# Patient Record
Sex: Female | Born: 1960 | Race: White | Hispanic: No | Marital: Married | Smoking: Former smoker
Health system: Southern US, Community
[De-identification: ages and names within clinical notes are randomized; demographics above are authoritative.]

## PROBLEM LIST (undated history)

## (undated) DIAGNOSIS — G510 Bell's palsy: Secondary | ICD-10-CM

## (undated) DIAGNOSIS — E119 Type 2 diabetes mellitus without complications: Secondary | ICD-10-CM

## (undated) DIAGNOSIS — D3500 Benign neoplasm of unspecified adrenal gland: Secondary | ICD-10-CM

## (undated) DIAGNOSIS — I341 Nonrheumatic mitral (valve) prolapse: Secondary | ICD-10-CM

## (undated) DIAGNOSIS — I219 Acute myocardial infarction, unspecified: Secondary | ICD-10-CM

## (undated) DIAGNOSIS — E785 Hyperlipidemia, unspecified: Secondary | ICD-10-CM

## (undated) DIAGNOSIS — K635 Polyp of colon: Secondary | ICD-10-CM

## (undated) HISTORY — PX: OTHER SURGICAL HISTORY: SHX169

## (undated) HISTORY — PX: ABDOMINAL HYSTERECTOMY: SHX81

## (undated) HISTORY — DX: Bell's palsy: G51.0

## (undated) HISTORY — PX: TONSILLECTOMY: SUR1361

## (undated) HISTORY — DX: Acute myocardial infarction, unspecified: I21.9

---

## 2012-12-23 DIAGNOSIS — E039 Hypothyroidism, unspecified: Secondary | ICD-10-CM | POA: Insufficient documentation

## 2013-02-17 DIAGNOSIS — Z6835 Body mass index (BMI) 35.0-35.9, adult: Secondary | ICD-10-CM | POA: Insufficient documentation

## 2013-03-01 DIAGNOSIS — E1169 Type 2 diabetes mellitus with other specified complication: Secondary | ICD-10-CM | POA: Insufficient documentation

## 2013-03-01 DIAGNOSIS — E119 Type 2 diabetes mellitus without complications: Secondary | ICD-10-CM | POA: Insufficient documentation

## 2013-04-19 ENCOUNTER — Emergency Department
Admission: EM | Admit: 2013-04-19 | Discharge: 2013-04-19 | Disposition: A | Discharge status: Home / Self Care | Payer: Managed Care, Other (non HMO) | Source: Home / Self Care | Attending: Family Medicine | Admitting: Family Medicine

## 2013-04-19 ENCOUNTER — Encounter: Payer: Self-pay | Admitting: Emergency Medicine

## 2013-04-19 DIAGNOSIS — L678 Other hair color and hair shaft abnormalities: Secondary | ICD-10-CM

## 2013-04-19 DIAGNOSIS — L738 Other specified follicular disorders: Secondary | ICD-10-CM

## 2013-04-19 DIAGNOSIS — L739 Follicular disorder, unspecified: Secondary | ICD-10-CM

## 2013-04-19 HISTORY — DX: Type 2 diabetes mellitus without complications: E11.9

## 2013-04-19 HISTORY — DX: Polyp of colon: K63.5

## 2013-04-19 HISTORY — DX: Benign neoplasm of unspecified adrenal gland: D35.00

## 2013-04-19 HISTORY — DX: Nonrheumatic mitral (valve) prolapse: I34.1

## 2013-04-19 HISTORY — DX: Hyperlipidemia, unspecified: E78.5

## 2013-04-19 MED ORDER — DOXYCYCLINE HYCLATE 100 MG PO CAPS: 100.0000 mg | ORAL_CAPSULE | Freq: Two times a day (BID) | ORAL | Status: DC

## 2013-04-19 NOTE — Discharge Instructions (Signed)
May take Benadryl for itching.

## 2013-04-19 NOTE — ED Notes (Signed)
Rash to both arms x 5 days. She was recently in [facility] staying in a hotel. Her husband does not have the rash/bites but her son did have a few spots on his fingers. Rash has spread since visit. C/o itching.

## 2013-04-19 NOTE — ED Provider Notes (Signed)
CSN: [id number]     Arrival date & time 04/19/13  2376 History   First MD Initiated Contact with Patient 04/19/13 [phone]     Chief Complaint  Patient presents with  . Rash      HPI Comments: Patient reports that she was at [facility] last week.  She admits that she did spend some time in a hot tub while in her hotel.  Four days ago she developed some small red bumps on her left arm that gradually increased in diameter.  She developed several similar lesions on right arm and upper legs.  The lesions are mildly pruritic.  She feels well otherwise, although she recalls feeling briefly fatigued with chills about a week ago.  Patient is a 53 y.o. female presenting with rash. The history is provided by the patient and the spouse.  Rash Pain location: arms and upper legs. Duration:  1 week Timing:  Constant Progression:  Worsening Chronicity:  New Context comment:  Beach trip Relieved by:  None tried Worsened by:  Nothing tried Ineffective treatments:  None tried Associated symptoms: no anorexia, no chills, no cough, no dysuria, no fatigue, no fever, no nausea, no sore throat and no vomiting     Past Medical History  Diagnosis Date  . Diabetes mellitus without complication   . Hyperlipemia   . Colon polyps   . Benign tumor of adrenal gland   . Mitral valve prolapse    Past Surgical History  Procedure Laterality Date  . Tonsillectomy    . Cesarean section    . Abdominal hysterectomy     Family History  Problem Relation Age of Onset  . Cancer Mother     breast CA  . Diabetes Mother   . Rheum arthritis Mother   . Rheum arthritis Father   . Heart disease Sister    History  Substance Use Topics  . Smoking status: Former Research scientist (life sciences)  . Smokeless tobacco: Never Used  . Alcohol Use: No   OB History   Grav Para Term Preterm Abortions TAB SAB Ect Mult Living                 Review of Systems  Constitutional: Negative for fever, chills and fatigue.  HENT: Negative for sore throat.    Respiratory: Negative for cough.   Gastrointestinal: Negative for nausea, vomiting and anorexia.  Genitourinary: Negative for dysuria.  Skin: Positive for rash.  All other systems reviewed and are negative.    Allergies  Codeine; Dilaudid; Motrin; Penicillins; and Quinolones  Home Medications   Current Outpatient Rx  Name  Route  Sig  Dispense  Refill  . Cholecalciferol (VITAMIN D-3 PO)   Oral   Take by mouth.         . estradiol (VIVELLE-DOT) 0.025 MG/24HR   Transdermal   Place 1 patch onto the skin 2 (two) times a week.         . Levothyroxine Sodium (TIROSINT) 100 MCG CAPS   Oral   Take by mouth daily before breakfast.         . metFORMIN (GLUCOPHAGE-XR) 750 MG 24 hr tablet   Oral   Take 750 mg by mouth daily with breakfast.         . progesterone (PROMETRIUM) 100 MG capsule   Oral   Take 100 mg by mouth daily.         Marland Kitchen doxycycline (VIBRAMYCIN) 100 MG capsule   Oral   Take 1 capsule (100 mg  total) by mouth 2 (two) times daily.   14 capsule   0    BP 144/73  Pulse 92  Temp(Src) 98 F (36.7 C) (Oral)  Resp 14  Ht 5\' 2"  (1.575 m)  Wt 198 lb (89.812 kg)  BMI 36.21 kg/m2  SpO2 96% Physical Exam  Nursing note and vitals reviewed. Constitutional: She is oriented to person, place, and time. She appears well-developed and well-nourished. No distress.  HENT:  Head: Normocephalic.  Nose: Nose normal.  Mouth/Throat: Oropharynx is clear and moist.  Eyes: Conjunctivae are normal. Pupils are equal, round, and reactive to light.  Neck: Neck supple.  Cardiovascular: Normal heart sounds.   Pulmonary/Chest: Breath sounds normal.  Abdominal: There is no tenderness.  Lymphadenopathy:    She has no cervical adenopathy.  Neurological: She is alert and oriented to person, place, and time.  Skin: Skin is warm and dry. Rash noted. Rash is macular.     Patient has scattered erythematous follicular lesions about 37mm dia with central eschar.  Lesions on left  arm have somewhat of a target appearance, ranging in size from about 56mm to 58mm.    ED Course  Procedures  none       MDM   1. Folliculitis;  "Hot tub folliculitis" is a possibility (usually caused by pseudomonas) which would tend to resolve spontaneously.    Begin doxycycline to cover staph May take Benadryl for itching. Followup with dermatologist if not improving one week.    Kandra Nicolas, MD 04/19/13 1147

## 2013-06-09 DIAGNOSIS — E781 Pure hyperglyceridemia: Secondary | ICD-10-CM | POA: Insufficient documentation

## 2013-06-23 ENCOUNTER — Encounter: Payer: Managed Care, Other (non HMO) | Admitting: Family Medicine

## 2013-07-06 ENCOUNTER — Ambulatory Visit (INDEPENDENT_AMBULATORY_CARE_PROVIDER_SITE_OTHER): Payer: Managed Care, Other (non HMO) | Admitting: Obstetrics & Gynecology

## 2013-07-06 ENCOUNTER — Telehealth: Payer: Self-pay | Admitting: *Deleted

## 2013-07-06 ENCOUNTER — Encounter: Payer: Self-pay | Admitting: Obstetrics & Gynecology

## 2013-07-06 VITALS — BP 131/76 | HR 79 | Resp 16 | Ht 62.0 in | Wt 195.0 lb

## 2013-07-06 DIAGNOSIS — Z01419 Encounter for gynecological examination (general) (routine) without abnormal findings: Secondary | ICD-10-CM

## 2013-07-06 DIAGNOSIS — Z0000 Encounter for general adult medical examination without abnormal findings: Secondary | ICD-10-CM

## 2013-07-06 NOTE — Progress Notes (Signed)
Subjective:    Tiffany Riddle is a 53 y.o. female who presents for an annual exam. The patient has no complaints today. She will need a refill of her Vivelle.She has been on it for 3 or 4 years. The patient is not currently sexually active. GYN screening history: last pap: was normal. The patient wears seatbelts: yes. The patient participates in regular exercise: yes. Has the patient ever been transfused or tattooed?: no. The patient reports that there is not domestic violence in her life.   Menstrual History: OB History   Grav Para Term Preterm Abortions TAB SAB Ect Mult Living   _0 Menarche age: 53  No LMP recorded. Patient has had a hysterectomy.    The following portions of the patient's history were reviewed and updated as appropriate: allergies, current medications, past family history, past medical history, past social history, past surgical history and problem list.  Review of Systems A comprehensive review of systems was negative. Her BRCA testing was negative, but she has a strong FH of breast cancer. Married for 24 years, Moved here from [place] last year. Homemaker. Abstinent because her husband has MS.She seen Julie Martinique, NP in [place].   Objective:    BP 131/76  Pulse 79  Resp 16  Ht _1  (1.575 m)  Wt 195 lb (88.451 kg)  BMI 35.66 kg/m2  General Appearance:    Alert, cooperative, no distress, appears stated age  Head:    Normocephalic, without obvious abnormality, atraumatic  Eyes:    PERRL, conjunctiva/corneas clear, EOM's intact, fundi    benign, both eyes  Ears:    Normal TM's and external ear canals, both ears  Nose:   Nares normal, septum midline, mucosa normal, no drainage    or sinus tenderness  Throat:   Lips, mucosa, and tongue normal; teeth and gums normal  Neck:   Supple, symmetrical, trachea midline, no adenopathy;    thyroid:  no enlargement/tenderness/nodules; no carotid   bruit or JVD  Back:     Symmetric, no curvature, ROM  normal, no CVA tenderness  Lungs:     Clear to auscultation bilaterally, respirations unlabored  Chest Wall:    No tenderness or deformity   Heart:    Regular rate and rhythm, S1 and S2 normal, no murmur, rub   or gallop  Breast Exam:    No tenderness, masses, or nipple abnormality  Abdomen:     Soft, non-tender, bowel sounds active all four quadrants,    no masses, no organomegaly  Genitalia:    Normal female without lesion, discharge or tenderness, No masses on bimanual     Extremities:   Extremities normal, atraumatic, no cyanosis or edema  Pulses:   2+ and symmetric all extremities  Skin:   Skin color, texture, turgor normal, no rashes or lesions  Lymph nodes:   Cervical, supraclavicular, and axillary nodes normal  Neurologic:   CNII-XII intact, normal strength, sensation and reflexes    throughout  .    Assessment:    Healthy female exam.    Plan:     Breast self exam technique reviewed and patient encouraged to perform self-exam monthly. Mammogram.  We had a long talk about HRT and their risks and benefits. I will continue to prescribe them as she likes. I also agreed to decrease the dose of estrogen. She will call with her choice. She is also aware that she  can stop them all.

## 2013-07-06 NOTE — Telephone Encounter (Signed)
ERROR

## 2013-07-17 DIAGNOSIS — Z860101 Personal history of adenomatous and serrated colon polyps: Secondary | ICD-10-CM | POA: Insufficient documentation

## 2013-09-22 ENCOUNTER — Other Ambulatory Visit: Payer: Self-pay | Admitting: *Deleted

## 2013-09-22 DIAGNOSIS — N951 Menopausal and female climacteric states: Secondary | ICD-10-CM

## 2013-09-22 MED ORDER — AMBULATORY NON FORMULARY MEDICATION: 1.0000 | Freq: Every day | Status: DC

## 2013-09-22 NOTE — Telephone Encounter (Signed)
Pt request RF on compounded estrogen.  She request it be switched to [place].

## 2013-11-16 DIAGNOSIS — Z803 Family history of malignant neoplasm of breast: Secondary | ICD-10-CM | POA: Insufficient documentation

## 2013-11-16 DIAGNOSIS — Z83719 Family history of colon polyps, unspecified: Secondary | ICD-10-CM | POA: Insufficient documentation

## 2014-01-17 ENCOUNTER — Encounter: Payer: Self-pay | Admitting: Obstetrics & Gynecology

## 2014-08-17 ENCOUNTER — Other Ambulatory Visit: Payer: Self-pay | Admitting: *Deleted

## 2014-08-17 DIAGNOSIS — N951 Menopausal and female climacteric states: Secondary | ICD-10-CM

## 2014-08-17 MED ORDER — AMBULATORY NON FORMULARY MEDICATION: 1.0000 | Freq: Every day | Status: DC

## 2014-08-24 ENCOUNTER — Ambulatory Visit (INDEPENDENT_AMBULATORY_CARE_PROVIDER_SITE_OTHER): Payer: Managed Care, Other (non HMO) | Admitting: Obstetrics & Gynecology

## 2014-08-24 ENCOUNTER — Encounter: Payer: Self-pay | Admitting: Obstetrics & Gynecology

## 2014-08-24 VITALS — Ht 62.0 in | Wt 197.0 lb

## 2014-08-24 DIAGNOSIS — Z0000 Encounter for general adult medical examination without abnormal findings: Secondary | ICD-10-CM

## 2014-08-24 DIAGNOSIS — Z01419 Encounter for gynecological examination (general) (routine) without abnormal findings: Secondary | ICD-10-CM

## 2014-08-24 DIAGNOSIS — N898 Other specified noninflammatory disorders of vagina: Secondary | ICD-10-CM

## 2014-08-24 MED ORDER — FLUCONAZOLE 150 MG PO TABS: 150.0000 mg | ORAL_TABLET | Freq: Once | ORAL | Status: DC

## 2014-08-24 MED ORDER — ESTRADIOL 0.025 MG/24HR TD PTTW: 1.0000 | MEDICATED_PATCH | TRANSDERMAL | Status: DC

## 2014-08-24 NOTE — Progress Notes (Signed)
Subjective:    Tiffany Riddle is a 54 y.o. MW P28  female who presents for an annual exam. The patient has no complaints today. The patient is not currently sexually active. GYN screening history: last pap: was normal. The patient wears seatbelts: yes. The patient participates in regular exercise: yes. Has the patient ever been transfused or tattooed?: no. The patient reports that there is not domestic violence in her life.   Menstrual History: OB History    Gravida Para Term Preterm AB TAB SAB Ectopic Multiple Living   1 1        1       Menarche age: 84  No LMP recorded. Patient has had a hysterectomy.    The following portions of the patient's history were reviewed and updated as appropriate: allergies, current medications, past family history, past medical history, past social history, past surgical history and problem list.  Review of Systems A comprehensive review of systems was negative. Her husband has MS. She is a Agricultural engineer.   Objective:    Ht 5\' 2"  (1.575 m)  Wt 197 lb (89.359 kg)  BMI 36.02 kg/m2  General Appearance:    Alert, cooperative, no distress, appears stated age  Head:    Normocephalic, without obvious abnormality, atraumatic  Eyes:    PERRL, conjunctiva/corneas clear, EOM's intact, fundi    benign, both eyes  Ears:    Normal TM's and external ear canals, both ears  Nose:   Nares normal, septum midline, mucosa normal, no drainage    or sinus tenderness  Throat:   Lips, mucosa, and tongue normal; teeth and gums normal  Neck:   Supple, symmetrical, trachea midline, no adenopathy;    thyroid:  no enlargement/tenderness/nodules; no carotid   bruit or JVD  Back:     Symmetric, no curvature, ROM normal, no CVA tenderness  Lungs:     Clear to auscultation bilaterally, respirations unlabored  Chest Wall:    No tenderness or deformity   Heart:    Regular rate and rhythm, S1 and S2 normal, no murmur, rub   or gallop  Breast Exam:    No tenderness, masses, or nipple  abnormality  Abdomen:     Soft, non-tender, bowel sounds active all four quadrants,    no masses, no organomegaly  Genitalia:    Normal female without lesion, discharge or tenderness, inflamed vulva, discharge c/w yeast, no masses noted with bimanual exam     Extremities:   Extremities normal, atraumatic, no cyanosis or edema  Pulses:   2+ and symmetric all extremities  Skin:   Skin color, texture, turgor normal, no rashes or lesions  Lymph nodes:   Cervical, supraclavicular, and axillary nodes normal  Neurologic:   CNII-XII intact, normal strength, sensation and reflexes    throughout  .    Assessment:    Healthy female exam.    Plan:     Breast self exam technique reviewed and patient encouraged to perform self-exam monthly. Wet prep. diflucan and refill of vivelle   3D mammogram

## 2014-08-25 LAB — WET PREP, GENITAL
Clue Cells Wet Prep HPF POC: NONE SEEN
TRICH WET PREP: NONE SEEN
WBC, Wet Prep HPF POC: NONE SEEN
YEAST WET PREP: NONE SEEN

## 2014-09-12 ENCOUNTER — Telehealth: Payer: Self-pay | Admitting: *Deleted

## 2014-09-12 DIAGNOSIS — Z780 Asymptomatic menopausal state: Secondary | ICD-10-CM

## 2014-09-12 MED ORDER — ESTRADIOL 0.075 MG/24HR TD PTTW: 1.0000 | MEDICATED_PATCH | TRANSDERMAL | Status: DC

## 2014-09-12 NOTE — Telephone Encounter (Signed)
Pt called stating that her RF on Vivelle Dot was called in as 0.025 instead of 0.075.  I called pharmacy and they said she has always gotten 0.075 mg.  RX changed and sent to [facility].

## 2015-05-08 ENCOUNTER — Other Ambulatory Visit: Payer: Self-pay | Admitting: *Deleted

## 2015-05-08 DIAGNOSIS — Z780 Asymptomatic menopausal state: Secondary | ICD-10-CM

## 2015-05-08 MED ORDER — ESTRADIOL 0.075 MG/24HR TD PTTW: 1.0000 | MEDICATED_PATCH | TRANSDERMAL | Status: DC

## 2015-05-08 NOTE — Telephone Encounter (Signed)
RF authorization for Vivelle Dot 0.075 mg sent to [facility] delivery.  Next appt due June 2017

## 2015-08-11 DIAGNOSIS — J449 Chronic obstructive pulmonary disease, unspecified: Secondary | ICD-10-CM | POA: Insufficient documentation

## 2015-12-26 ENCOUNTER — Other Ambulatory Visit: Payer: Self-pay | Admitting: Obstetrics & Gynecology

## 2015-12-26 DIAGNOSIS — Z780 Asymptomatic menopausal state: Secondary | ICD-10-CM

## 2015-12-28 ENCOUNTER — Other Ambulatory Visit: Payer: Self-pay

## 2015-12-28 DIAGNOSIS — Z780 Asymptomatic menopausal state: Secondary | ICD-10-CM

## 2015-12-28 MED ORDER — ESTRADIOL 0.075 MG/24HR TD PTTW: 1.0000 | MEDICATED_PATCH | TRANSDERMAL | 1 refills | Status: DC

## 2015-12-28 NOTE — Telephone Encounter (Signed)
Pt called and needed Vivelle Dot refilled. I told her that she needed an appt before we could do anymore refills and she stated that she was out of town for a while. I spoke with Sherryle Lis, RN and she said we could give her one more refill. Pt stated that she will call and make appt soon.

## 2016-02-01 ENCOUNTER — Ambulatory Visit (INDEPENDENT_AMBULATORY_CARE_PROVIDER_SITE_OTHER): Payer: Managed Care, Other (non HMO) | Admitting: Obstetrics & Gynecology

## 2016-02-01 ENCOUNTER — Encounter: Payer: Self-pay | Admitting: Obstetrics & Gynecology

## 2016-02-01 VITALS — BP 125/66 | HR 80 | Resp 16 | Ht 62.0 in | Wt 178.0 lb

## 2016-02-01 DIAGNOSIS — Z780 Asymptomatic menopausal state: Secondary | ICD-10-CM

## 2016-02-01 DIAGNOSIS — Z01419 Encounter for gynecological examination (general) (routine) without abnormal findings: Secondary | ICD-10-CM | POA: Diagnosis not present

## 2016-02-01 DIAGNOSIS — Z803 Family history of malignant neoplasm of breast: Secondary | ICD-10-CM

## 2016-02-01 MED ORDER — ESTRADIOL 0.075 MG/24HR TD PTTW: 1.0000 | MEDICATED_PATCH | TRANSDERMAL | 6 refills | Status: DC

## 2016-02-01 NOTE — Progress Notes (Signed)
Subjective:    Tiffany Riddle is a 55 y.o. MW P78(22 yo son)  female who presents for an annual exam. The patient has no complaints today. She needs a refill of her HRT. The patient is not currently sexually active. GYN screening history: last pap: was normal. The patient wears seatbelts: yes. The patient participates in regular exercise: yes. Has the patient ever been transfused or tattooed?: no. The patient reports that there is not domestic violence in her life.   Menstrual History: OB History    Gravida Para Term Preterm AB Living   '1 1       1   ' SAB TAB Ectopic Multiple Live Births                  Menarche age: 7 No LMP recorded. Patient has had a hysterectomy.    The following portions of the patient's history were reviewed and updated as appropriate: allergies, current medications, past family history, past medical history, past social history, past surgical history and problem list.  Review of Systems Pertinent items are noted in HPI. 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 Had a colonoscopy, due next year FH- No colon, gyn cancer + Breast cancer in P aunt and uncle, mother, maunt, mGM Her Brca testing about 6 years ago was negative, saw a Dietitian She declines a flu vaccine today   Objective:    BP 125/66   Pulse 80   Resp 16   Ht '5\' 2"'  (1.575 m)   Wt 178 lb (80.7 kg)   BMI 32.56 kg/m   General Appearance:    Alert, cooperative, no distress, appears stated age  Head:    Normocephalic, without obvious abnormality, atraumatic  Eyes:    PERRL, conjunctiva/corneas clear, EOM's intact, fundi    benign, both eyes  Ears:    Normal TM's and external ear canals, both ears  Nose:   Nares normal, septum midline, mucosa normal, no drainage    or sinus tenderness  Throat:   Lips, mucosa, and tongue normal; teeth and gums normal  Neck:   Supple, symmetrical, trachea midline, no adenopathy;    thyroid:  no  enlargement/tenderness/nodules; no carotid   bruit or JVD  Back:     Symmetric, no curvature, ROM normal, no CVA tenderness  Lungs:     Clear to auscultation bilaterally, respirations unlabored  Chest Wall:    No tenderness or deformity   Heart:    Regular rate and rhythm, S1 and S2 normal, no murmur, rub   or gallop  Breast Exam:    No tenderness, masses, or nipple abnormality  Abdomen:     Soft, non-tender, bowel sounds active all four quadrants,    no masses, no organomegaly  Genitalia:    Normal female without lesion, discharge or tenderness, mild v v a, no masses, normal vaginal mucosa     Extremities:   Extremities normal, atraumatic, no cyanosis or edema  Pulses:   2+ and symmetric all extremities  Skin:   Skin color, texture, turgor normal, no rashes or lesions  Lymph nodes:   Cervical, supraclavicular, and axillary nodes normal  Neurologic:   CNII-XII intact, normal strength, sensation and reflexes    throughout   .    Assessment:    Healthy female exam.    Plan:     consider annual MRI of breast due to extreme FH.   She has 3D  mammogram scheduled next month Rec stop the prometrium She would like to continue her compounded testosterone

## 2016-02-01 NOTE — Addendum Note (Signed)
Addended by: Asencion Islam on: 02/01/2016 02:22 PM   Modules accepted: Orders

## 2016-05-28 ENCOUNTER — Other Ambulatory Visit: Payer: Self-pay

## 2016-05-28 DIAGNOSIS — Z780 Asymptomatic menopausal state: Secondary | ICD-10-CM

## 2016-05-28 MED ORDER — ESTRADIOL 0.075 MG/24HR TD PTTW: 1.0000 | MEDICATED_PATCH | TRANSDERMAL | 6 refills | Status: DC

## 2017-03-17 ENCOUNTER — Encounter: Payer: Self-pay | Admitting: Obstetrics & Gynecology

## 2017-03-17 ENCOUNTER — Other Ambulatory Visit: Payer: Self-pay | Admitting: Obstetrics & Gynecology

## 2017-03-17 DIAGNOSIS — Z780 Asymptomatic menopausal state: Secondary | ICD-10-CM

## 2017-03-25 ENCOUNTER — Encounter: Payer: Self-pay | Admitting: Obstetrics & Gynecology

## 2017-03-25 ENCOUNTER — Ambulatory Visit (INDEPENDENT_AMBULATORY_CARE_PROVIDER_SITE_OTHER): Payer: Managed Care, Other (non HMO) | Admitting: Obstetrics & Gynecology

## 2017-03-25 DIAGNOSIS — Z780 Asymptomatic menopausal state: Secondary | ICD-10-CM

## 2017-03-25 DIAGNOSIS — Z01419 Encounter for gynecological examination (general) (routine) without abnormal findings: Secondary | ICD-10-CM

## 2017-03-25 MED ORDER — ESTRADIOL 0.075 MG/24HR TD PTTW: 1.0000 | MEDICATED_PATCH | TRANSDERMAL | 6 refills | Status: DC

## 2017-03-25 NOTE — Progress Notes (Signed)
Subjective:    Tiffany Riddle is a 57 y.o. married P1 (son 61) female who presents for an annual exam. The patient has no complaints today. The patient is not currently sexually active ( husband with MS and decreased libido)  GYN screening history: last pap: was normal. The patient wears seatbelts: yes. The patient participates in regular exercise: yes. Has the patient ever been transfused or tattooed?: no. The patient reports that there is not domestic violence in her life.   Menstrual History: OB History    Gravida Para Term Preterm AB Living   1 1       1    SAB TAB Ectopic Multiple Live Births                  Menarche age: 68 No LMP recorded. Patient has had a hysterectomy.    The following portions of the patient's history were reviewed and updated as appropriate: allergies, current medications, past family history, past medical history, past social history, past surgical history and problem list.  Review of Systems Pertinent items are noted in HPI.    Objective:    BP 139/75   Pulse 79   Resp 16   Ht 5\' 2"  (1.575 m)   Wt 190 lb (86.2 kg)   BMI 34.75 kg/m   General Appearance:    Alert, cooperative, no distress, appears stated age  Head:    Normocephalic, without obvious abnormality, atraumatic  Eyes:    PERRL, conjunctiva/corneas clear, EOM's intact, fundi    benign, both eyes  Ears:    Normal TM's and external ear canals, both ears  Nose:   Nares normal, septum midline, mucosa normal, no drainage    or sinus tenderness  Throat:   Lips, mucosa, and tongue normal; teeth and gums normal  Neck:   Supple, symmetrical, trachea midline, no adenopathy;    thyroid:  no enlargement/tenderness/nodules; no carotid   bruit or JVD  Back:     Symmetric, no curvature, ROM normal, no CVA tenderness  Lungs:     Clear to auscultation bilaterally, respirations unlabored  Chest Wall:    No tenderness or deformity   Heart:    Regular rate and rhythm, S1 and S2 normal, no murmur, rub    or gallop  Breast Exam:    No tenderness, masses, or nipple abnormality  Abdomen:     Soft, non-tender, bowel sounds active all four quadrants,    no masses, no organomegaly  Genitalia:    Normal female without lesion, discharge or tenderness, no palpable masses or tenderness     Extremities:   Extremities normal, atraumatic, no cyanosis or edema  Pulses:   2+ and symmetric all extremities  Skin:   Skin color, texture, turgor normal, no rashes or lesions  Lymph nodes:   Cervical, supraclavicular, and axillary nodes normal  Neurologic:   CNII-XII intact, normal strength, sensation and reflexes    throughout  .    Assessment:    Healthy female exam.    Plan:     refill ERT, discussed ACOG rec. I have certainly agreed to refill it.   Rec repeat genetic testing as she had it many years ago (better testing now) Insurance would not pay for MRI of breasts so she plans annual mammograms

## 2017-12-29 ENCOUNTER — Other Ambulatory Visit: Payer: Self-pay | Admitting: Obstetrics & Gynecology

## 2017-12-29 DIAGNOSIS — Z780 Asymptomatic menopausal state: Secondary | ICD-10-CM

## 2018-02-04 ENCOUNTER — Telehealth: Payer: Self-pay | Admitting: *Deleted

## 2018-02-04 NOTE — Telephone Encounter (Signed)
Husband called giving updated pharmacy info for future RX.  Pharmacy changed to [facility] delivery

## 2018-05-07 ENCOUNTER — Other Ambulatory Visit: Payer: Self-pay | Admitting: Obstetrics & Gynecology

## 2018-05-07 ENCOUNTER — Encounter: Payer: Self-pay | Admitting: Obstetrics & Gynecology

## 2018-05-07 ENCOUNTER — Ambulatory Visit (INDEPENDENT_AMBULATORY_CARE_PROVIDER_SITE_OTHER): Payer: 59 | Admitting: Obstetrics & Gynecology

## 2018-05-07 VITALS — BP 168/77 | HR 82 | Ht 62.0 in | Wt 184.0 lb

## 2018-05-07 DIAGNOSIS — Z780 Asymptomatic menopausal state: Secondary | ICD-10-CM

## 2018-05-07 DIAGNOSIS — Z01419 Encounter for gynecological examination (general) (routine) without abnormal findings: Secondary | ICD-10-CM | POA: Diagnosis not present

## 2018-05-07 DIAGNOSIS — Z23 Encounter for immunization: Secondary | ICD-10-CM

## 2018-05-07 MED ORDER — ESTRADIOL 0.075 MG/24HR TD PTTW: MEDICATED_PATCH | TRANSDERMAL | 3 refills | Status: DC

## 2018-05-07 NOTE — Progress Notes (Signed)
Subjective:    Tiffany Riddle is a 58 y.o. female who presents for an annual exam. The patient has no complaints today. The patient is not currently sexually active. GYN screening history: last pap: was normal. The patient wears seatbelts: yes. The patient participates in regular exercise: not asked. Has the patient ever been transfused or tattooed?: not asked. The patient reports that there is not domestic violence in her life.   Menstrual History: OB History    Gravida  1   Para  1   Term      Preterm      AB      Living  1     SAB      TAB      Ectopic      Multiple      Live Births               No LMP recorded. Patient has had a hysterectomy.    The following portions of the patient's history were reviewed and updated as appropriate: allergies, current medications, past family history, past medical history, past social history, past surgical history and problem list.  Review of Systems Pertinent items are noted in HPI.   Pertinent items are noted in HPI. 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 Had a colonoscopy, due next year FH- No colon, gyn cancer + Breast cancer in P aunt and uncle, mother, maunt, mGM Her Brca testing about 6 years ago was negative, saw a Dietitian    Objective:    BP (!) 168/77   Pulse 82   Ht '5\' 2"'  (1.575 m)   Wt 184 lb (83.5 kg)   BMI 33.65 kg/m   General Appearance:    Alert, cooperative, no distress, appears stated age  Head:    Normocephalic, without obvious abnormality, atraumatic  Eyes:    PERRL, conjunctiva/corneas clear, EOM's intact, fundi    benign, both eyes  Ears:    Normal TM's and external ear canals, both ears  Nose:   Nares normal, septum midline, mucosa normal, no drainage    or sinus tenderness  Throat:   Lips, mucosa, and tongue normal; teeth and gums normal  Neck:   Supple, symmetrical, trachea midline, no adenopathy;    thyroid:  no  enlargement/tenderness/nodules; no carotid   bruit or JVD  Back:     Symmetric, no curvature, ROM normal, no CVA tenderness  Lungs:     Clear to auscultation bilaterally, respirations unlabored  Chest Wall:    No tenderness or deformity   Heart:    Regular rate and rhythm, S1 and S2 normal, no murmur, rub   or gallop  Breast Exam:    No tenderness, masses, or nipple abnormality  Abdomen:     Soft, non-tender, bowel sounds active all four quadrants,    no masses, no organomegaly  Genitalia:    Normal female without lesion, discharge or tenderness, mild vulvovaginal atrophy, no cervix present, no masses or tenderness with bimanual exam     Extremities:   Extremities normal, atraumatic, no cyanosis or edema  Pulses:   2+ and symmetric all extremities  Skin:   Skin color, texture, turgor normal, no rashes or lesions  Lymph nodes:   Cervical, supraclavicular, and axillary nodes normal  Neurologic:   CNII-XII intact, normal strength, sensation and reflexes    throughout  .    Assessment:    Healthy female  exam.    Plan:     TDAP today   Mammogram ordered Check TSH and Hep C

## 2018-05-08 LAB — HEPATITIS C ANTIBODY
Hepatitis C Ab: NONREACTIVE
SIGNAL TO CUT-OFF: 0.09 (ref <1.00)

## 2018-05-08 LAB — TSH: TSH: 2.55 mIU/L (ref 0.40–4.50)

## 2018-08-18 ENCOUNTER — Other Ambulatory Visit: Payer: Self-pay | Admitting: *Deleted

## 2018-08-18 DIAGNOSIS — Z780 Asymptomatic menopausal state: Secondary | ICD-10-CM

## 2018-08-18 MED ORDER — ESTRADIOL 0.075 MG/24HR TD PTTW: MEDICATED_PATCH | TRANSDERMAL | 3 refills | Status: DC

## 2018-09-24 ENCOUNTER — Encounter: Payer: Self-pay | Admitting: Obstetrics & Gynecology

## 2018-09-24 ENCOUNTER — Ambulatory Visit (INDEPENDENT_AMBULATORY_CARE_PROVIDER_SITE_OTHER): Payer: Managed Care, Other (non HMO) | Admitting: Obstetrics & Gynecology

## 2018-09-24 ENCOUNTER — Other Ambulatory Visit: Payer: Self-pay

## 2018-09-24 VITALS — BP 174/82 | HR 89 | Wt 181.0 lb

## 2018-09-24 DIAGNOSIS — M79604 Pain in right leg: Secondary | ICD-10-CM | POA: Diagnosis not present

## 2018-09-24 NOTE — Addendum Note (Signed)
Addended by: Asencion Islam on: 09/24/2018 04:22 PM   Modules accepted: Orders

## 2018-09-24 NOTE — Progress Notes (Signed)
   Subjective:    Patient ID: Tiffany Riddle, female    DOB: Apr 11, 1960, 58 y.o.   MRN: [id number]  HPI 58 yo married P (51 yo son) here today with the issue of right leg bruising about a month ago followed by pain in her right calf, thigh, groin and hip. The pain started about 2 weeks ago and has been constant. The pain has not changed. She is able to exercise, has some pain with bending her left knee. She has tried IBU but she had some petechia on her left fingers so she stopped taking it. It did "take the edge off" of her pain. She has tried Tylenol but doesn't think that this helps.  She saw a PA (primary care) about this. She offered to send her for an Xray but it was not ordered.  She has been on Vivelle dot since her hysterctomy about 9 years ago.  She went to the Urgent Care last night but told her to go to the ER. She went to the ER and her BP was elevated. She waited for 2 hours and then left prior to being seen.  Review of Systems She has had a hysterectomy Hba1C 8.7 with her primary care    Objective:   Physical Exam Breathing, conversing, and ambulating normally Well nourished, well hydrated White female, no apparent distress I measured up 28 cm from her ankle bone on each leg and then measured the girth of each calf. They both measured 38 cm. I did not see any swelling or other abnormalities.     Assessment & Plan:  Right leg/hip/groin pain- probably not a DVT, but I will order doppler studies Rec that she stop her ERT Offer a referral to sports med doc but she already goes to [facility] I have rec'd that she see her primary care provider regarding her BPs.

## 2018-09-24 NOTE — Addendum Note (Signed)
Addended by: Asencion Islam on: 09/24/2018 04:06 PM   Modules accepted: Orders

## 2018-09-24 NOTE — Addendum Note (Signed)
Addended by: Emily Filbert on: 09/24/2018 04:08 PM   Modules accepted: Orders

## 2018-09-30 ENCOUNTER — Other Ambulatory Visit: Payer: Self-pay

## 2018-09-30 ENCOUNTER — Ambulatory Visit (HOSPITAL_BASED_OUTPATIENT_CLINIC_OR_DEPARTMENT_OTHER)
Admission: RE | Admit: 2018-09-30 | Discharge: 2018-09-30 | Disposition: A | Discharge status: Home / Self Care | Payer: Managed Care, Other (non HMO) | Source: Ambulatory Visit | Attending: Obstetrics & Gynecology | Admitting: Obstetrics & Gynecology

## 2018-09-30 DIAGNOSIS — M79604 Pain in right leg: Secondary | ICD-10-CM | POA: Diagnosis not present

## 2019-01-13 DIAGNOSIS — I10 Essential (primary) hypertension: Secondary | ICD-10-CM | POA: Insufficient documentation

## 2019-07-27 DIAGNOSIS — D4709 Other mast cell neoplasms of uncertain behavior: Secondary | ICD-10-CM | POA: Insufficient documentation

## 2019-09-15 ENCOUNTER — Encounter: Payer: Self-pay | Admitting: *Deleted

## 2019-12-08 ENCOUNTER — Encounter: Payer: Self-pay | Admitting: *Deleted

## 2019-12-08 DIAGNOSIS — Z887 Allergy status to serum and vaccine status: Secondary | ICD-10-CM | POA: Insufficient documentation

## 2020-02-03 ENCOUNTER — Encounter: Payer: Self-pay | Admitting: Obstetrics and Gynecology

## 2020-02-03 ENCOUNTER — Other Ambulatory Visit: Payer: Self-pay

## 2020-02-03 ENCOUNTER — Ambulatory Visit (INDEPENDENT_AMBULATORY_CARE_PROVIDER_SITE_OTHER): Payer: Managed Care, Other (non HMO) | Admitting: Obstetrics and Gynecology

## 2020-02-03 VITALS — BP 152/71 | HR 78 | Ht 62.0 in | Wt 177.0 lb

## 2020-02-03 DIAGNOSIS — N951 Menopausal and female climacteric states: Secondary | ICD-10-CM

## 2020-02-03 DIAGNOSIS — Z01419 Encounter for gynecological examination (general) (routine) without abnormal findings: Secondary | ICD-10-CM

## 2020-02-03 DIAGNOSIS — Z780 Asymptomatic menopausal state: Secondary | ICD-10-CM

## 2020-02-03 MED ORDER — ESTRADIOL 0.075 MG/24HR TD PTTW: MEDICATED_PATCH | TRANSDERMAL | 3 refills | Status: DC

## 2020-02-03 NOTE — Progress Notes (Signed)
GYNECOLOGY ANNUAL PREVENTATIVE CARE ENCOUNTER NOTE  Subjective:   Tiffany Riddle is a 59 y.o. G1P1 female here for a annual gynecologic exam. Current complaints: none. Needs refill on estadiol patch.    Denies abnormal vaginal bleeding, discharge, pelvic pain, problems with intercourse or other gynecologic concerns. Declines STI screen.   Gynecologic History No LMP recorded. Patient has had a hysterectomy. Contraception: post menopausal status Last Pap: n/a s/p hysterectomy Last mammogram: 2020. Results: Birads 2 DEXA: has never had  Obstetric History OB History  Gravida Para Term Preterm AB Living  1 1       1   SAB TAB Ectopic Multiple Live Births               # Outcome Date GA Lbr Len/2nd Weight Sex Delivery Anes PTL Lv  1 Para 01/11/94   9 lb 9 oz (4.338 kg) M        Past Medical History:  Diagnosis Date  . Bell's palsy   . Benign tumor of adrenal gland   . Colon polyps   . Diabetes mellitus without complication ([place])   . Hyperlipemia   . Mitral valve prolapse     Past Surgical History:  Procedure Laterality Date  . ABDOMINAL HYSTERECTOMY    . CESAREAN SECTION    . TONSILLECTOMY      Current Outpatient Medications on File Prior to Visit  Medication Sig Dispense Refill  . ARMOUR THYROID 15 MG tablet     . ALPRAZolam (XANAX) 0.25 MG tablet Take by mouth. (Patient not taking: Reported on 02/03/2020)    . AMBULATORY NON FORMULARY MEDICATION Testosterone 2% Apply 3x/weekly  15Gms (Patient not taking: Reported on 02/03/2020)    . azelastine (ASTELIN) 0.1 % nasal spray Place into the nose.    . cetirizine (ZYRTEC) 10 MG tablet Take by mouth. (Patient not taking: Reported on 02/03/2020)    . Cholecalciferol (VITAMIN D-3 PO) Take by mouth. (Patient not taking: Reported on 02/03/2020)    . fluocinonide (LIDEX) 0.05 % external solution  (Patient not taking: Reported on 02/03/2020)    . fluticasone (FLONASE) 50 MCG/ACT nasal spray  (Patient not taking: Reported on  02/03/2020)    . glucose blood ([employer] TEST STRIPS) test strip True Metrix test strips. Test qd as directed (Patient not taking: Reported on 02/03/2020)    . metFORMIN (GLUCOPHAGE) 1000 MG tablet Take 1,000 mg by mouth 2 (two) times daily with a meal. (Patient not taking: Reported on 02/03/2020)    . PROAIR HFA 108 (90 BASE) MCG/ACT inhaler  (Patient not taking: Reported on 02/03/2020)     No current facility-administered medications on file prior to visit.    Allergies  Allergen Reactions  . Aspirin Hives, Itching and Other (See Comments)    ONLY allergic to uncoated due to ulcer Ulcer can take EC asa Ulcer can take EC asa Ulcer can take EC asa ONLY allergic to uncoated due to ulcer Other reaction(s): Other (See Comments) Ulcer can take EC asa Ulcer can take EC asa ONLY allergic to uncoated due to ulcer Ulcer can take EC asa ONLY allergic to uncoated due to ulcer Ulcer can take EC asa   . Levothyroxine Anaphylaxis, Itching and Other (See Comments)    Leg cramps Throat swelling Leg cramps   . Clindamycin/Lincomycin Nausea And Vomiting  . Hydromorphone Nausea And Vomiting and Other (See Comments)    Hypersensitive Hypersensitive Hypersensitive Hypersensitive Hypersensitive Hypersensitive Hypersensitive Hypersensitive Hypersensitive Other reaction(s): Other (See Comments) Hypersensitive  Hypersensitive Hypersensitive Hypersensitive Hypersensitive Hypersensitive Hypersensitive Hypersensitive Hypersensitive Hypersensitive Hypersensitive Hypersensitive   . Mometasone Other (See Comments)    Thrush  . Nickel Hives  . Codeine   . Dilaudid [Hydromorphone Hcl]   . Doxycycline Other (See Comments)    HEADACHE  . Motrin [Ibuprofen]   . Penicillins   . Quinolones     Social History   Socioeconomic History  . Marital status: Married    Spouse name: Not on file  . Number of children: Not on file  . Years of education: Not on file  . Highest education level:  Not on file  Occupational History  . Not on file  Tobacco Use  . Smoking status: Former Research scientist (life sciences)  . Smokeless tobacco: Never Used  Substance and Sexual Activity  . Alcohol use: No  . Drug use: No  . Sexual activity: Not Currently  Other Topics Concern  . Not on file  Social History Narrative  . Not on file   Social Determinants of Health   Financial Resource Strain:   . Difficulty of Paying Living Expenses: Not on file  Food Insecurity:   . Worried About Charity fundraiser in the Last Year: Not on file  . Ran Out of Food in the Last Year: Not on file  Transportation Needs:   . Lack of Transportation (Medical): Not on file  . Lack of Transportation (Non-Medical): Not on file  Physical Activity:   . Days of Exercise per Week: Not on file  . Minutes of Exercise per Session: Not on file  Stress:   . Feeling of Stress : Not on file  Social Connections:   . Frequency of Communication with Friends and Family: Not on file  . Frequency of Social Gatherings with Friends and Family: Not on file  . Attends Religious Services: Not on file  . Active Member of Clubs or Organizations: Not on file  . Attends Archivist Meetings: Not on file  . Marital Status: Not on file  Intimate Partner Violence:   . Fear of Current or Ex-Partner: Not on file  . Emotionally Abused: Not on file  . Physically Abused: Not on file  . Sexually Abused: Not on file    Family History  Problem Relation Age of Onset  . Cancer Mother        breast CA  . Diabetes Mother   . Rheum arthritis Mother   . Rheum arthritis Father   . Heart disease Sister     The following portions of the patient's history were reviewed and updated as appropriate: allergies, current medications, past family history, past medical history, past social history, past surgical history and problem list.  Review of Systems Pertinent items are noted in HPI.   Objective:  BP (!) 152/71   Pulse 78   Ht 5\' 2"  (1.575 m)   Wt  177 lb (80.3 kg)   BMI 32.37 kg/m  CONSTITUTIONAL: Well-developed, well-nourished female in no acute distress.  HENT:  Normocephalic, atraumatic, External right and left ear normal. Oropharynx is clear and moist EYES: Conjunctivae and EOM are normal. Pupils are equal, round, and reactive to light. No scleral icterus.  NECK: Normal range of motion, supple, no masses.  Normal thyroid.  SKIN: Skin is warm and dry. No rash noted. Not diaphoretic. No erythema. No pallor. NEUROLOGIC: Alert and oriented to person, place, and time. Normal reflexes, muscle tone coordination. No cranial nerve deficit noted. PSYCHIATRIC: Normal mood and affect. Normal behavior. Normal  judgment and thought content. CARDIOVASCULAR: Normal heart rate noted RESPIRATORY: Effort normal, no problems with respiration noted. BREASTS: Symmetric in size. No masses, skin changes, nipple drainage, or lymphadenopathy. ABDOMEN: Soft, no distention noted.  No tenderness, rebound or guarding.  PELVIC: Normal appearing external genitalia; normal appearing vaginal mucosa and cervix.  No abnormal discharge noted.  Uterus and ovaries surgically absent, no fullness or tenderness in pelvix appreciated MUSCULOSKELETAL: Normal range of motion. No tenderness.  No cyanosis, clubbing, or edema.  2+ distal pulses.  Exam done with chaperone present.   Assessment and Plan:   1. Well woman exam Healthy female exam S/p hysterectomy  2. Menopausal symptoms - Patient has been on patch for about 10 years, since menopause. She is very happy with it, was off briefly earlier this year for knee surgery and had severe hot flashes - reviewed risks of continuing estrogen including breast cancer and cardiovascular risks, particularly > 10 years or over age 43 and recommended plan for taper/discontinue in the near future - she would like to continue at current dose for now, will consider tapering in near future - Rx refilled   Mammogram UTD Referral for  colonoscopy n/a DEXA not due based on age  Routine preventative health maintenance measures emphasized. Please refer to After Visit Summary for other counseling recommendations.   Total face-to-face time with patient: 30 minutes. Over 50% of encounter was spent on counseling and coordination of care.   Feliz Beam, M.D. Attending Center for [employer] Fish farm manager)

## 2020-02-03 NOTE — Progress Notes (Signed)
Last mam- 11/19/19- normal

## 2021-01-31 ENCOUNTER — Ambulatory Visit: Payer: Managed Care, Other (non HMO) | Admitting: Family Medicine

## 2021-02-02 DIAGNOSIS — I252 Old myocardial infarction: Secondary | ICD-10-CM | POA: Insufficient documentation

## 2021-02-09 DIAGNOSIS — Z951 Presence of aortocoronary bypass graft: Secondary | ICD-10-CM | POA: Insufficient documentation

## 2021-02-19 ENCOUNTER — Telehealth: Payer: Self-pay | Admitting: *Deleted

## 2021-02-19 MED ORDER — NITROFURANTOIN MONOHYD MACRO 100 MG PO CAPS: 100.0000 mg | ORAL_CAPSULE | Freq: Two times a day (BID) | ORAL | 0 refills | Status: DC

## 2021-02-19 NOTE — Telephone Encounter (Signed)
Pt called stating that she had open heart surgery 02/06/21 and cannot drive.  She is having urinary frequency and pain.  Per Dr Gala Romney may send in Macrobid BID x 5 days.  If no better then pt will need to make arrangements to be seen in either our office or her PCP.  Pt voices understanding.

## 2021-02-27 ENCOUNTER — Other Ambulatory Visit: Payer: Self-pay | Admitting: *Deleted

## 2021-02-27 NOTE — Telephone Encounter (Signed)
Received fax RF from [facility] for Vivelle Dot.  Pt's last annual was 11/21.  She has recently had open heart surgery and is unable to drive into office for appt.  1 RF on Vivelle given and pt is aware that she needs appt for annual and mammogram before any further Rf's.

## 2021-03-28 ENCOUNTER — Ambulatory Visit: Payer: Managed Care, Other (non HMO) | Admitting: Family Medicine

## 2021-05-01 IMAGING — US RIGHT LOWER EXTREMITY VENOUS ULTRASOUND
1 series · 13 of 24 positions shown · non-contrast
Comparison: None.

CLINICAL DATA: Right lower extremity pain and edema for the past
month. Evaluate for DVT.



[Series 1: right lower extremity venous ultrasound · 13 of 37 slices shown]
[im 1/37]
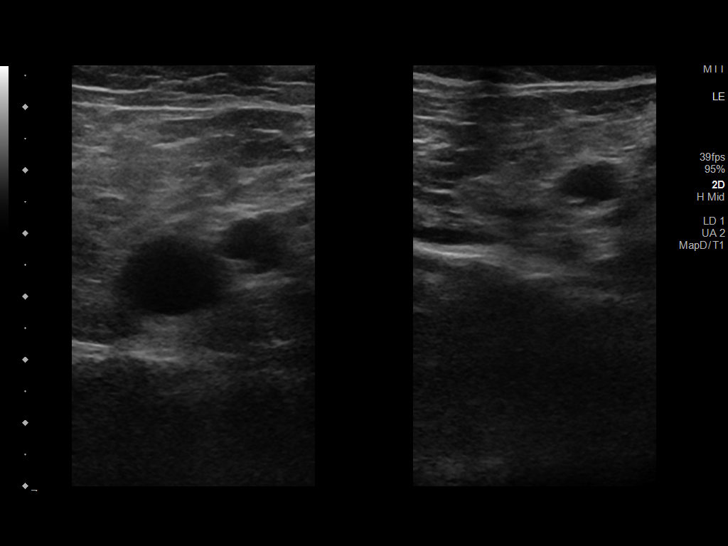
[im 4/37]
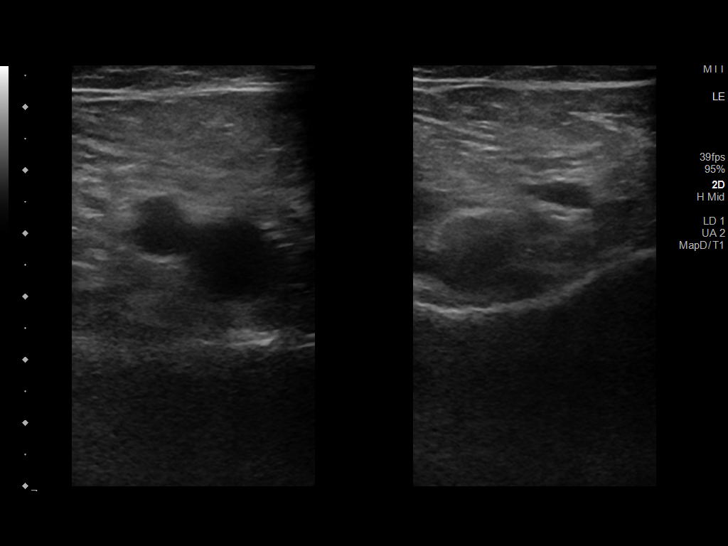
[im 7/37]
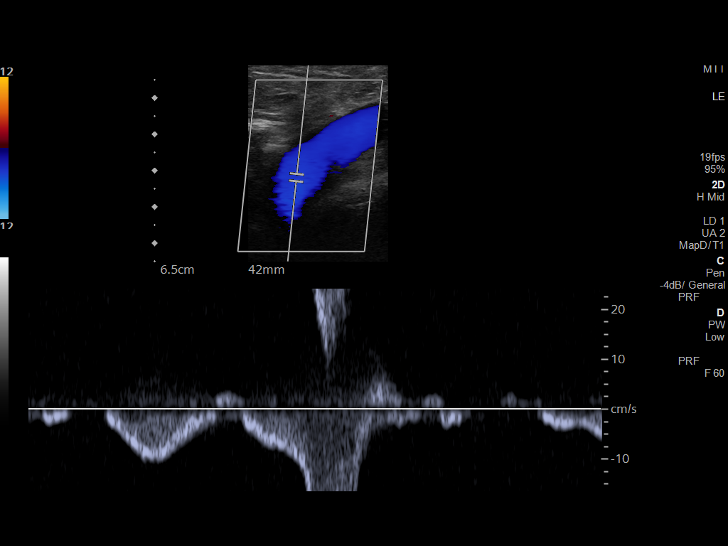
[im 10/37]
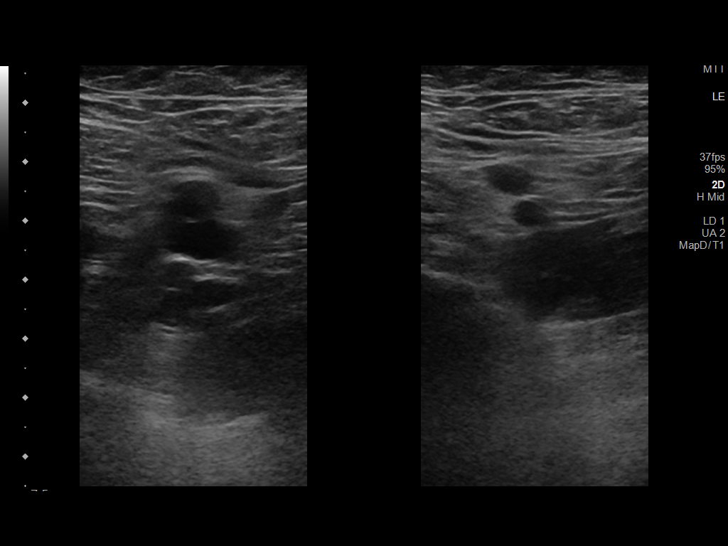
[im 13/37]
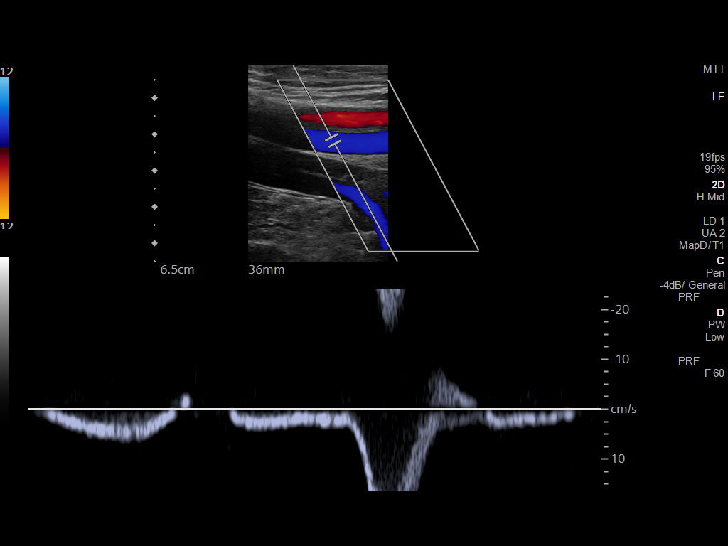
[im 16/37]
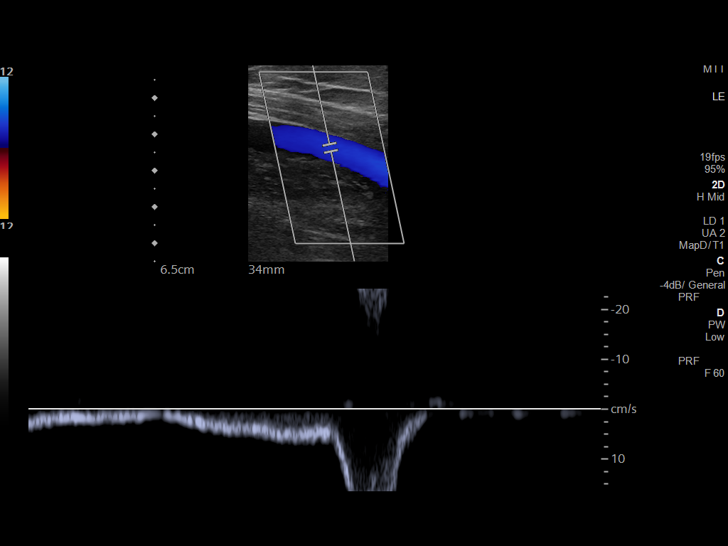
[im 19/37]
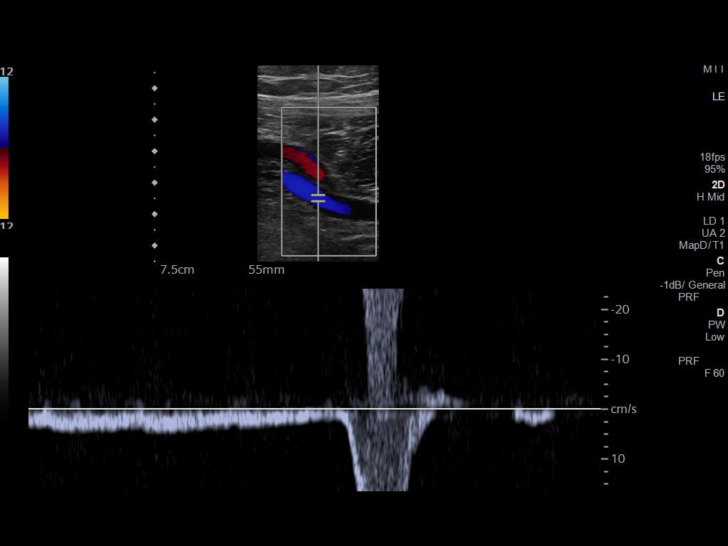
[im 21/37]
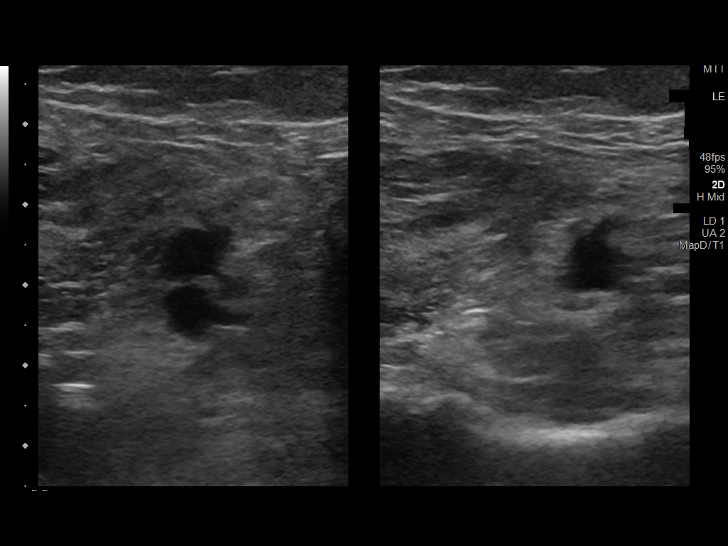
[im 24/37]
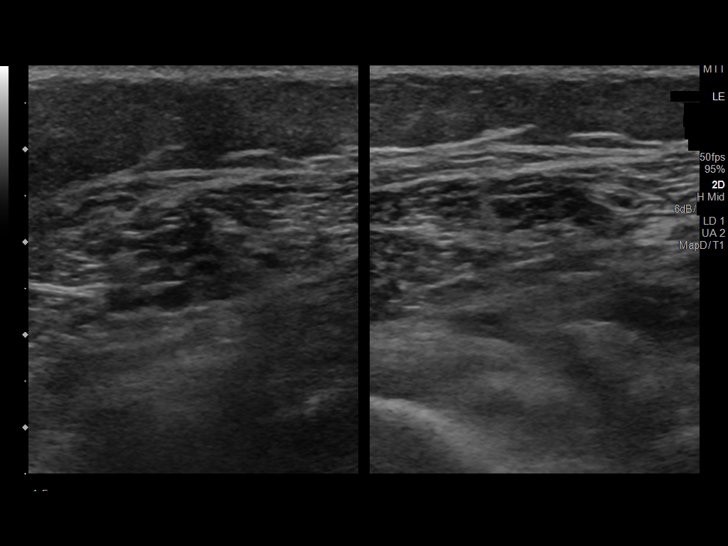
[im 27/37]
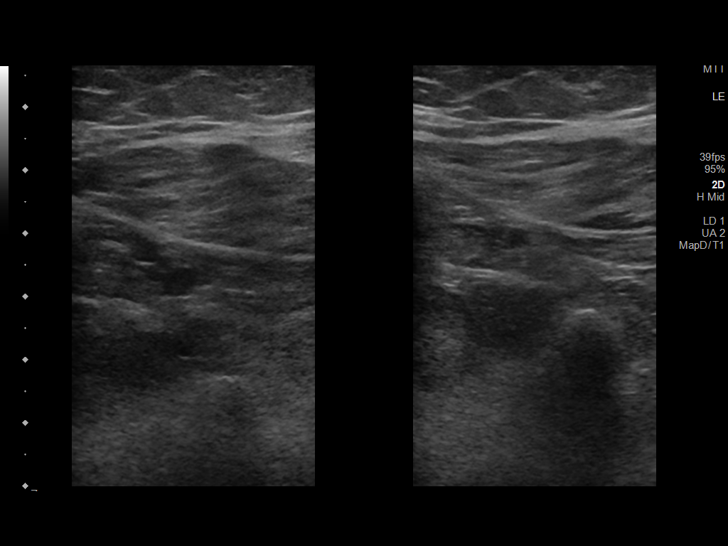
[im 30/37]
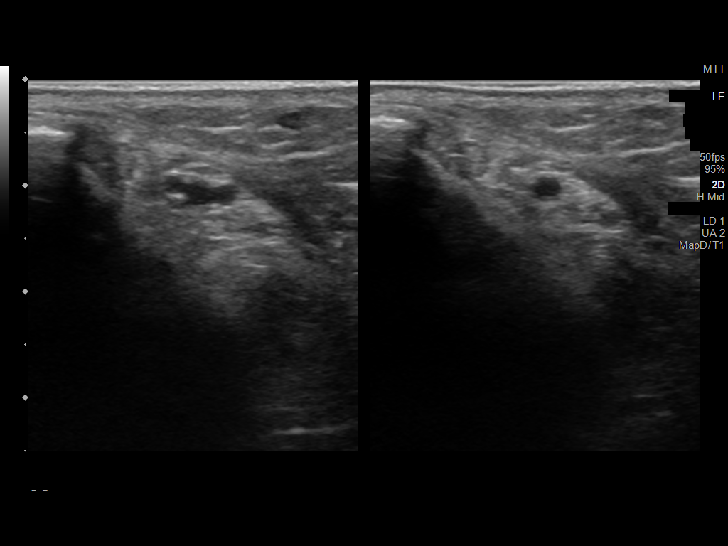
[im 33/37]
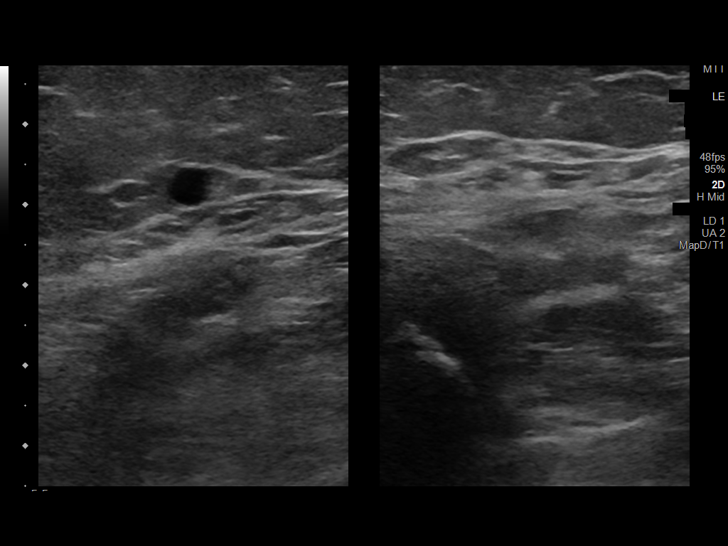
[im 37/37]
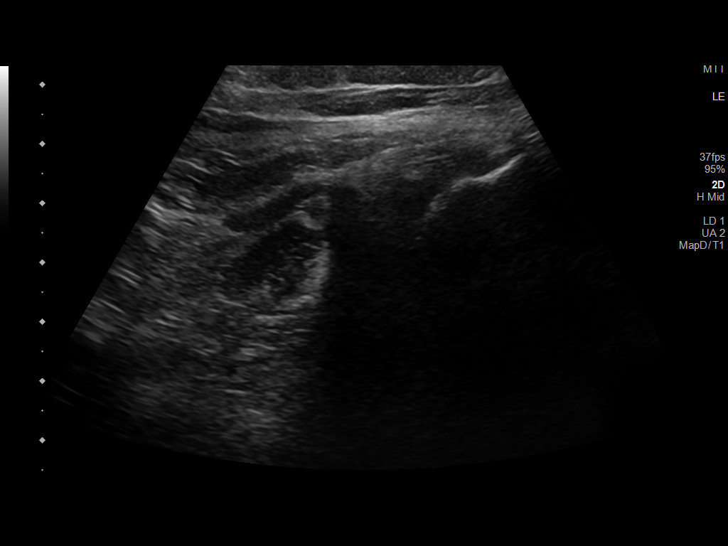

[13 of 24 positions shown; findings below may reference images not displayed]

FINDINGS: Contralateral Common Femoral Vein: Respiratory phasicity is normal
and symmetric with the symptomatic side. No evidence of thrombus.
Normal compressibility.

Common Femoral Vein: No evidence of thrombus. Normal
compressibility, respiratory phasicity and response to augmentation.

Saphenofemoral Junction: No evidence of thrombus. Normal
compressibility and flow on color Doppler imaging.

Profunda Femoral Vein: No evidence of thrombus. Normal
compressibility and flow on color Doppler imaging.

Femoral Vein: No evidence of thrombus. Normal compressibility,
respiratory phasicity and response to augmentation.

Popliteal Vein: No evidence of thrombus. Normal compressibility,
respiratory phasicity and response to augmentation.

Calf Veins: No evidence of thrombus. Normal compressibility and flow
on color Doppler imaging.

Superficial Great Saphenous Vein: No evidence of thrombus. Normal
compressibility.

Venous Reflux:  None.

Other Findings:  None.
IMPRESSION: No evidence of DVT within the right lower extremity.

## 2021-06-05 ENCOUNTER — Ambulatory Visit: Payer: Managed Care, Other (non HMO) | Admitting: Family Medicine

## 2021-08-10 ENCOUNTER — Ambulatory Visit: Payer: Managed Care, Other (non HMO) | Admitting: Family Medicine

## 2021-09-06 ENCOUNTER — Encounter: Payer: Self-pay | Admitting: Obstetrics and Gynecology

## 2021-09-06 ENCOUNTER — Ambulatory Visit (INDEPENDENT_AMBULATORY_CARE_PROVIDER_SITE_OTHER): Payer: Managed Care, Other (non HMO) | Admitting: Obstetrics and Gynecology

## 2021-09-06 VITALS — BP 132/73 | HR 73 | Resp 16 | Ht 62.0 in | Wt 192.0 lb

## 2021-09-06 DIAGNOSIS — Z1231 Encounter for screening mammogram for malignant neoplasm of breast: Secondary | ICD-10-CM

## 2021-09-06 DIAGNOSIS — Z01419 Encounter for gynecological examination (general) (routine) without abnormal findings: Secondary | ICD-10-CM

## 2021-09-06 DIAGNOSIS — N951 Menopausal and female climacteric states: Secondary | ICD-10-CM | POA: Diagnosis not present

## 2021-09-06 MED ORDER — ESTRADIOL 0.025 MG/24HR TD PTTW: 1.0000 | MEDICATED_PATCH | TRANSDERMAL | 0 refills | Status: DC

## 2021-09-07 ENCOUNTER — Encounter: Payer: Self-pay | Admitting: Obstetrics and Gynecology

## 2021-09-12 ENCOUNTER — Encounter: Payer: Self-pay | Admitting: Obstetrics and Gynecology

## 2021-09-12 DIAGNOSIS — Z780 Asymptomatic menopausal state: Secondary | ICD-10-CM

## 2021-09-12 MED ORDER — ESTRADIOL 0.075 MG/24HR TD PTTW: MEDICATED_PATCH | TRANSDERMAL | 3 refills | Status: DC

## 2022-07-31 ENCOUNTER — Other Ambulatory Visit: Payer: Self-pay | Admitting: Family Medicine

## 2022-07-31 DIAGNOSIS — Z780 Asymptomatic menopausal state: Secondary | ICD-10-CM

## 2022-08-08 ENCOUNTER — Other Ambulatory Visit: Payer: Self-pay | Admitting: *Deleted

## 2022-08-08 DIAGNOSIS — Z780 Asymptomatic menopausal state: Secondary | ICD-10-CM

## 2022-08-08 MED ORDER — ESTRADIOL 0.075 MG/24HR TD PTTW
MEDICATED_PATCH | TRANSDERMAL | 3 refills | Status: DC
Start: 2022-08-08 — End: 2023-07-11

## 2022-08-08 NOTE — Telephone Encounter (Signed)
RF sent to [facility] for Estradiol Vivelle Dot.  Pt has annual scheduled for 6/24.

## 2022-08-21 ENCOUNTER — Encounter: Payer: Self-pay | Admitting: Obstetrics & Gynecology

## 2022-08-21 ENCOUNTER — Other Ambulatory Visit (HOSPITAL_COMMUNITY)
Admission: RE | Admit: 2022-08-21 | Discharge: 2022-08-21 | Disposition: A | Discharge status: Home / Self Care | Payer: Managed Care, Other (non HMO) | Source: Ambulatory Visit | Attending: Obstetrics & Gynecology | Admitting: Obstetrics & Gynecology

## 2022-08-21 ENCOUNTER — Ambulatory Visit: Payer: Managed Care, Other (non HMO) | Admitting: Obstetrics & Gynecology

## 2022-08-21 VITALS — BP 167/76 | HR 64 | Ht 62.0 in | Wt 196.0 lb

## 2022-08-21 DIAGNOSIS — N760 Acute vaginitis: Secondary | ICD-10-CM | POA: Insufficient documentation

## 2022-08-21 NOTE — Progress Notes (Signed)
   GYNECOLOGY OFFICE VISIT NOTE  History:   Celissa Ternes is a 62 y.o. G1P1 with h/o of T2DM here today for evaluation of recurrent vulvovaginitis episode. Had a yeast infection a couple of months ago. Reports clumpy vaginal discharge.  She denies any abnormal vaginal bleeding, pelvic pain or other concerns.    Past Medical History:  Diagnosis Date   Bell's palsy    Benign tumor of adrenal gland    Colon polyps    Diabetes mellitus without complication ([facility])    Heart attack ([facility])    Hyperlipemia    Mitral valve prolapse     Past Surgical History:  Procedure Laterality Date   ABDOMINAL HYSTERECTOMY     CESAREAN SECTION     heart bypass     TONSILLECTOMY      The following portions of the patient's history were reviewed and updated as appropriate: allergies, current medications, past family history, past medical history, past social history, past surgical history and problem list.   Review of Systems:  Pertinent items noted in HPI and remainder of comprehensive ROS otherwise negative.  Physical Exam:  BP (!) 167/76   Pulse 64   Ht 5\' 2"  (1.575 m)   Wt 196 lb (88.9 kg)   BMI 35.85 kg/m  CONSTITUTIONAL: Well-developed, well-nourished female in no acute distress.  SKIN: No rash noted. Not diaphoretic. No erythema. No pallor. MUSCULOSKELETAL: Normal range of motion. No edema noted. NEUROLOGIC: Alert and oriented to person, place, and time. Normal muscle tone coordination. No cranial nerve deficit noted. PSYCHIATRIC: Normal mood and affect. Normal behavior. Normal judgment and thought content. CARDIOVASCULAR: Normal heart rate noted RESPIRATORY: Effort and breath sounds normal, no problems with respiration noted ABDOMEN: No masses noted. No other overt distention noted.   PELVIC: Mild erythema noted diffusely on medical aspects of both labia ad introitus, otherwise normal appearing external genitalia; normal urethral meatus; normal appearing distal vaginal mucosa. Scant  white discharge noted, testing sample obtained.  Performed in the presence of a chaperone     Assessment and Plan:    1. Vulvovaginitis - Cervicovaginal ancillary only( [place]) done, will follow up results and manage accordingly.  Proper vulvar hygiene emphasized: discussed avoidance of perfumed soaps, detergents, lotions and any type of douches; in addition to wearing cotton underwear and no underwear at night.  Also recommended cleaning front to back, voiding and cleaning up after intercourse.  Routine preventative health maintenance measures emphasized. Please refer to After Visit Summary for other counseling recommendations.   Return for follow up as recommended.    I spent 25 minutes dedicated to the care of this patient including pre-visit review of records, face to face time with the patient discussing her conditions and treatments and post visit orders.    Jaynie Collins, MD, FACOG Obstetrician & Gynecologist, [facility] for [employer], [facility]

## 2022-08-22 LAB — CERVICOVAGINAL ANCILLARY ONLY
Bacterial Vaginitis (gardnerella): NEGATIVE
Candida Glabrata: NEGATIVE
Candida Vaginitis: NEGATIVE
Comment: NEGATIVE
Comment: NEGATIVE
Comment: NEGATIVE
Comment: NEGATIVE
Trichomonas: NEGATIVE

## 2022-09-09 ENCOUNTER — Encounter: Payer: Self-pay | Admitting: Obstetrics and Gynecology

## 2022-09-09 ENCOUNTER — Ambulatory Visit (INDEPENDENT_AMBULATORY_CARE_PROVIDER_SITE_OTHER): Payer: Managed Care, Other (non HMO) | Admitting: Obstetrics and Gynecology

## 2022-09-09 VITALS — BP 152/79 | HR 63 | Resp 16 | Ht 62.0 in | Wt 195.0 lb

## 2022-09-09 DIAGNOSIS — L309 Dermatitis, unspecified: Secondary | ICD-10-CM | POA: Diagnosis not present

## 2022-09-09 DIAGNOSIS — Z1231 Encounter for screening mammogram for malignant neoplasm of breast: Secondary | ICD-10-CM

## 2022-09-09 DIAGNOSIS — Z01419 Encounter for gynecological examination (general) (routine) without abnormal findings: Secondary | ICD-10-CM

## 2022-09-09 MED ORDER — HYDROCORTISONE VALERATE 0.2 % EX OINT: 1.0000 | TOPICAL_OINTMENT | Freq: Every day | CUTANEOUS | 0 refills | Status: DC

## 2022-09-09 NOTE — Progress Notes (Signed)
ANNUAL EXAM Patient name: Tiffany Riddle MRN [id number]  Date of birth: 1960/09/28 Chief Complaint:   Annual Exam  History of Present Illness:   Tiffany Riddle is a 62 y.o. G1P1 s/p TAH/BSO (2011 - path benign) being seen today for a routine annual exam.   Current complaints: Recently saw Dr. Macon Large for vulvar itching. I personally reviewed her cultures from 08/21/22 that were all normal. Pt was counseled on vulvar care and she has made those changes. Her itching has improved but not resolved.   Has been on HRT with estrogen patches since her TAH/BSO. Has history of NSTEMI and CABG in 2022. Has been counseled on recommendation for decreasing/stopping her hormone therapy, but tried to decrease dose last year and had unacceptable hot flashes. Has previously reported that her cardiologist was OK with her being on estrogen.   Last pap n/a. H/O abnormal pap: no Last mammogram: 11/27/21. Results were: normal. Last colonoscopy: 08/11/20. Results were: abnormal does her colonscopy every 3 years      No data to display               No data to display          Review of Systems:   Pertinent items are noted in HPI Denies any headaches, blurred vision, fatigue, shortness of breath, chest pain, abdominal pain, abnormal vaginal discharge/itching/odor/irritation, problems with periods, bowel movements, urination, or intercourse unless otherwise stated above. Pertinent History Reviewed:  Reviewed past medical,surgical, social and family history.  Reviewed problem list, medications and allergies. Physical Assessment:   Vitals:   09/09/22 1502  BP: (!) 152/79  Pulse: 63  Resp: 16  Weight: 195 lb (88.5 kg)  Height: 5\' 2"  (1.575 m)  Body mass index is 35.67 kg/m.        Physical Examination:   General appearance - well appearing, and in no distress  Mental status - alert, oriented to person, place, and time  Chest - respiratory effort normal  Heart - normal peripheral  perfusion  Breasts - breasts appear normal, no suspicious masses, no skin or nipple changes or axillary nodes  Abdomen - soft, nontender, nondistended, no masses or organomegaly  Pelvic - VULVA: small 2cm area of erythema, excoriation on left labia majora c/w contact/irritant dermatitis. Otherwise normal appearing vulva with no masses or tenderness.  VAGINA: normal appearing vagina with normal color and discharge, no lesions   UTERUS: surgically absent  ADNEXA: surgically absent  Chaperone present for exam  No results found for this or any previous visit (from the past 24 hour(s)).  Assessment & Plan:  1) Well-Woman Exam Mammogram: due 11/2022, ordered Colonoscopy: due 2025 Pap: n/a - s/p hysterectomy GC/CT: declines HIV/HCV: declines -- Discussed menopausal hormone therapy and recommendation for cessation given history of NSTEMI. Reviewed risks of thromboembolic events. Would use the lowest dose for shortest time possible now that she is in her 15s. She declines tapering/cessation at this time, is agreeable to revisiting the discussion at her annual exams  2) Vulvar dermatitis Discussed vulvar care, continue current practices Pt to call if symptoms don't resolve after steroid ointment  -     hydrocortisone valerate ointment (WEST-CORT) 0.2 %; Apply 1 Application topically at bedtime. Apply a thin layer of ointment to the affected area nightly for two weeks  Labs/procedures today:   Orders Placed This Encounter  Procedures   MM Digital Screening   Meds:  Meds ordered this encounter  Medications   hydrocortisone valerate ointment (WEST-CORT)  0.2 %    Sig: Apply 1 Application topically at bedtime. Apply a thin layer of ointment to the affected area nightly for two weeks    Dispense:  15 g    Refill:  0   Follow-up: No follow-ups on file.  Lennart Pall, MD 09/09/2022 4:47 PM

## 2022-09-09 NOTE — Progress Notes (Signed)
Last mammogram- 11/27/21

## 2023-06-24 ENCOUNTER — Other Ambulatory Visit: Payer: Self-pay | Admitting: Family Medicine

## 2023-06-24 DIAGNOSIS — Z780 Asymptomatic menopausal state: Secondary | ICD-10-CM

## 2023-07-11 ENCOUNTER — Other Ambulatory Visit: Payer: Self-pay | Admitting: *Deleted

## 2023-07-11 DIAGNOSIS — Z780 Asymptomatic menopausal state: Secondary | ICD-10-CM

## 2023-07-11 MED ORDER — ESTRADIOL 0.075 MG/24HR TD PTTW
MEDICATED_PATCH | TRANSDERMAL | 3 refills | Status: AC
Start: 2023-07-11 — End: ?

## 2023-08-14 ENCOUNTER — Telehealth: Payer: Self-pay | Admitting: *Deleted

## 2023-08-14 NOTE — Telephone Encounter (Signed)
 Returned call from 1:24 PM. Left patient a message to call and reschedule annual for July.

## 2023-09-10 ENCOUNTER — Ambulatory Visit: Admitting: Obstetrics and Gynecology

## 2023-10-01 ENCOUNTER — Ambulatory Visit: Admitting: Obstetrics and Gynecology

## 2023-10-08 ENCOUNTER — Encounter: Payer: Self-pay | Admitting: Obstetrics and Gynecology

## 2023-10-08 ENCOUNTER — Other Ambulatory Visit (HOSPITAL_COMMUNITY)
Admission: RE | Admit: 2023-10-08 | Discharge: 2023-10-08 | Disposition: A | Discharge status: Home / Self Care | Source: Ambulatory Visit | Attending: Obstetrics and Gynecology | Admitting: Obstetrics and Gynecology

## 2023-10-08 ENCOUNTER — Ambulatory Visit: Admitting: Obstetrics and Gynecology

## 2023-10-08 VITALS — BP 106/58 | HR 72 | Ht 62.0 in | Wt 189.0 lb

## 2023-10-08 DIAGNOSIS — N951 Menopausal and female climacteric states: Secondary | ICD-10-CM

## 2023-10-08 DIAGNOSIS — Z1331 Encounter for screening for depression: Secondary | ICD-10-CM

## 2023-10-08 DIAGNOSIS — N898 Other specified noninflammatory disorders of vagina: Secondary | ICD-10-CM

## 2023-10-08 DIAGNOSIS — Z01419 Encounter for gynecological examination (general) (routine) without abnormal findings: Secondary | ICD-10-CM | POA: Diagnosis not present

## 2023-10-08 DIAGNOSIS — R32 Unspecified urinary incontinence: Secondary | ICD-10-CM | POA: Diagnosis not present

## 2023-10-08 DIAGNOSIS — Z1231 Encounter for screening mammogram for malignant neoplasm of breast: Secondary | ICD-10-CM

## 2023-10-08 NOTE — Progress Notes (Deleted)
-   We discussed non-hormonal options with first line being SSRIs/SNRIs  -- Reviewed that it can take a few days to weeks to see the effect  -- Paxil (paroxetine) is the only FDA-approved anti-depressant for the treatment of hot flashes. There is a low dose option (7.5mg /d), but insurance coverage is limited.  -- Citalopram, escitalopram, venlafaxine, and desvenlafaxine are other options though venlafaxine & desvenlafaxine tend to have more side effects (nausea/vomiting) -- Gabapentin is an option for women who don't tolerate SSRI/SNRI's and seem to help women whose hot flashes are worst at night. Side effects include headache, dizziness, and drowsiness  -- Fezolinetant (Veozah) - neurokinin 3 receptor (NK3R) antagonist that works at the level of the hypothalamus. This is not an antidepressant. There can be rare liver issue related to the medicine, so the FDA recommends getting liver enzymes before starting treatment and every 3 months for the first 9 months of treatment.  - We discussed the possibility of using a progesterone-only method for treatment including aygestin 10mg  daily or micronized progesterone 300mg  daily. There are no long term studies that look at safety of long term use of progesterone-only treatments for hot flashes.

## 2023-10-08 NOTE — Patient Instructions (Signed)
 Avoid: - Synthetic underwear - Tight pants - Swim suits, thongs, leotards, leggings for prolonged periods of time - Scented soap/shampoo - Bubble baths - Scented detergents, dryer sheets - Baby wipes - Feminine sprays, douches, powders - Panty liners - Dyed toilet paper - Shaving  Trying swapping out the above for: - Cotton or no underwear - Loose pants, skirts, dresses - Changing out of swimwear, thongs, and workout gear as soon as you're done exercising - Fragrance free soaps (like Dove sensitive skin) - Warm plain water baths - Unscented laundry detergent - Use a bedet or peri bottle to rinse instead of baby wipes - Tampons, cotton pads, cotton period underwear - Undyed toilet paper - Clipping hair

## 2023-10-08 NOTE — Progress Notes (Signed)
   ANNUAL EXAM Patient name: Tiffany Riddle MRN [id number]  Date of birth: 07-Dec-1960 Chief Complaint:   Gynecologic Exam  History of Present Illness:   Tiffany Riddle is a 63 y.o. G1P1 with No LMP recorded. Patient has had a hysterectomy. being seen today for a routine annual exam.  Current complaints: Vaginal discharge/irritation. On farxiga. Also having mixed urinary incontinence   Last pap n/a - TAH/BSO Last mammogram: 2024 BIRADS 2 Last colonoscopy: 2021, q31yr now      No data to display               No data to display           Review of Systems:   Pertinent items are noted in HPI Denies any headaches, blurred vision, fatigue, shortness of breath, chest pain, abdominal pain, abnormal vaginal discharge/itching/odor/irritation, problems with periods, bowel movements, urination, or intercourse unless otherwise stated above. Pertinent History Reviewed:  Reviewed past medical,surgical, social and family history.  Reviewed problem list, medications and allergies. Physical Assessment:   Vitals:   10/08/23 1504 10/08/23 1512  BP: (!) 147/79 (!) 106/58  Pulse: 73 72  Weight: 189 lb (85.7 kg)   Height: 5' 2 (1.575 m)   Body mass index is 34.57 kg/m.        Physical Examination:   General appearance - well appearing, and in no distress  Mental status - alert, oriented to person, place, and time  Chest - respiratory effort normal  Heart - normal peripheral perfusion  Breasts - breasts appear normal, no suspicious masses, no skin or nipple changes or axillary nodes  Abdomen - soft, nontender, nondistended, no masses or organomegaly  Pelvic - VULVA: normal appearing vulva with no masses, tenderness or lesions. Minor erythema and white discharge c/f VVC  Chaperone present for exam  No results found for this or any previous visit (from the past 24 hours).  Assessment & Plan:  1) Well-Woman Exam Mammogram: Ordered Colonoscopy: Per GI Pap: n/a -     MM 3D  SCREENING MAMMOGRAM BILATERAL BREAST; Future  Urinary incontinence, unspecified type -     Ambulatory referral to Physical Therapy  Vaginal discharge -     Cervicovaginal ancillary only( [place])  Vasomotor symptoms due to menopause Hx CABG. Reports cardiologist has no issue with continuation of estradiol  Discussed in general we work for lowest dose for shortest time esp once in 60s when risks appear to increase esp with her cardiac history She has had issues w/ hot flashes in the past. Open to weaning in the fall. Discussed non hormonal options like SSRI (lexapro, paxil - leaning lexapro if needed) or veozah   Labs/procedures today:  Orders Placed This Encounter  Procedures   MM 3D SCREENING MAMMOGRAM BILATERAL BREAST   Ambulatory referral to Physical Therapy   Meds: No orders of the defined types were placed in this encounter.  Follow-up: Return in about 3 months (around 01/08/2024) for virutal follow up for hormone weaning plan.  Kieth JAYSON Carolin, MD 10/08/2023 3:48 PM

## 2023-10-09 ENCOUNTER — Ambulatory Visit: Payer: Self-pay | Admitting: Obstetrics and Gynecology

## 2023-10-09 LAB — CERVICOVAGINAL ANCILLARY ONLY
Bacterial Vaginitis (gardnerella): NEGATIVE
Candida Glabrata: NEGATIVE
Candida Vaginitis: NEGATIVE
Comment: NEGATIVE
Comment: NEGATIVE
Comment: NEGATIVE

## 2023-12-03 ENCOUNTER — Ambulatory Visit: Attending: Obstetrics and Gynecology | Admitting: Physical Therapy

## 2023-12-03 ENCOUNTER — Other Ambulatory Visit: Payer: Self-pay

## 2023-12-03 ENCOUNTER — Encounter: Payer: Self-pay | Admitting: Physical Therapy

## 2023-12-03 DIAGNOSIS — R279 Unspecified lack of coordination: Secondary | ICD-10-CM | POA: Insufficient documentation

## 2023-12-03 DIAGNOSIS — M6281 Muscle weakness (generalized): Secondary | ICD-10-CM | POA: Insufficient documentation

## 2023-12-03 DIAGNOSIS — R293 Abnormal posture: Secondary | ICD-10-CM | POA: Diagnosis present

## 2023-12-03 DIAGNOSIS — R32 Unspecified urinary incontinence: Secondary | ICD-10-CM | POA: Insufficient documentation

## 2023-12-03 DIAGNOSIS — N393 Stress incontinence (female) (male): Secondary | ICD-10-CM | POA: Insufficient documentation

## 2023-12-03 NOTE — Therapy (Signed)
 OUTPATIENT PHYSICAL THERAPY FEMALE PELVIC EVALUATION   Patient Name: Tiffany Riddle MRN: [id number] DOB:[birth year], 64 y.o., female Today's Date: 12/03/2023  END OF SESSION:  PT End of Session - 12/03/23 1234     Visit Number 1    Date for PT Re-Evaluation 06/01/24    Authorization Type cigna    PT Start Time 1230    PT Stop Time 1314    PT Time Calculation (min) 44 min    Activity Tolerance Patient tolerated treatment well    Behavior During Therapy WFL for tasks assessed/performed          Past Medical History:  Diagnosis Date   Bell's palsy    Benign tumor of adrenal gland    Colon polyps    Diabetes mellitus without complication (HCC)    Heart attack (HCC)    Hyperlipemia    Mitral valve prolapse    Past Surgical History:  Procedure Laterality Date   ABDOMINAL HYSTERECTOMY     CESAREAN SECTION     heart bypass     TONSILLECTOMY     There are no active problems to display for this patient.   PCP: Radiontchenko, Alexei, MD   REFERRING PROVIDER: Erik Kieth BROCKS, MD   REFERRING DIAG: R32 (ICD-10-CM) - Urinary incontinence, unspecified type  THERAPY DIAG:  Unspecified lack of coordination  Muscle weakness (generalized)  Abnormal posture  Stress incontinence  Rationale for Evaluation and Treatment: Rehabilitation  ONSET DATE: 1.5 year  SUBJECTIVE:                                                                                                                                                                                           SUBJECTIVE STATEMENT: Urinary incontinence with sneezing/laughing/coughing and urgency. Reports had leakage about 1.5 years ago with UTI with antibiotics but has since hasn't struggled with infection but urinary incontinence  has remained.    Fluid intake: water - 80oz with zero sugar lemonade very small amount, tea herbal sometimes  FUNCTIONAL LIMITATIONS: walking as far or biking longer and fearful due to needing  to use restroom  PERTINENT HISTORY:  Medications for current condition: no Surgeries: total hysterectomy, c-section  Other:  Sexual abuse: No  DIAGNOSTIC FINDINGS:  Post-void residual: Voiding Cystourethrogram (VCUG):  Ultrasound: PAIN:  Are you having pain? No  PRECAUTIONS: None  RED FLAGS: None   WEIGHT BEARING RESTRICTIONS: No  FALLS:  Has patient fallen in last 6 months? No  OCCUPATION: retired   ACTIVITY LEVEL : low - trying to bike more (stationary), walks though not consistently   PLOF: Independent  PATIENT GOALS: to have no leakage  BOWEL MOVEMENT: Pain with bowel movement: No Type of bowel movement:Type (Bristol Stool Scale) 4, Frequency varies with IBS sometimes 4x daily to every other, and Strain no Fully empty rectum: Yes:   Leakage: Yes: can't get there quickly enough (usually happens while in car and can't stop) very infrequently     Pads: No Fiber supplement/laxative not usually occasionally will use metamucil   URINATION: Pain with urination: No Fully empty bladder: Yes:  feels like she needs to tip forward         Post-void dribble: No Stream: Strong Urgency: Yes  Frequency:during the day  every   2 hours                                                          Nocturia: Yes: 1-2x   Leakage: Urge to void, Coughing, Sneezing, Laughing, Exercise, and Lifting Pads/briefs: Yes: liner sometimes but not always  INTERCOURSE:  Ability to have vaginal penetration Yes  Pain with intercourse: none Dryness: Yes  Climax: not painful Marinoff Scale: 0/3  PREGNANCY: Vaginal deliveries 0 Tearing No Episiotomy No C-section deliveries 1 Currently pregnant No  PROLAPSE: none   OBJECTIVE:  Note: Objective measures were completed at Evaluation unless otherwise noted.  DIAGNOSTIC FINDINGS:    COGNITION: Overall cognitive status: Within functional limits for tasks assessed     SENSATION: Light touch: Appears intact   FUNCTIONAL TESTS:     Single leg stance: bil pelvic rotation and instability noted  Rt:7s  Lt:2s but recently injured ankle per pt Sit-up test:1/3 :  GAIT: WFL  POSTURE: rounded shoulders, forward head, and posterior pelvic tilt   LUMBARAROM/PROM:  A/PROM A/PROM  Eval (% available)  Flexion 75  Extension 100  Right lateral flexion 75 with low back pain  Left lateral flexion 75 with low back pain  Right rotation 75  Left rotation 75   (Blank rows = not tested)  LOWER EXTREMITY ROM:  Bil hamstrings and adductors limited by 25%  LOWER EXTREMITY MMT:  Bil hips grossly 4/5 PALPATION:  General: tightness in bil lumbar and thoracic paraspinals   Pelvic Alignment: WFL  Abdominal: no TTP, restrictions tension in mid lower quadrant  Breathing: chest breathing Scar tissue: Yes: c-section and abdominal hysterectomy scar sites with restrictions                External Perineal Exam: pt deferred                              Internal Pelvic Floor: pt deferred   Patient confirms identification and approves PT to assess internal pelvic floor and treatment No  PELVIC MMT:   MMT eval  Vaginal   Internal Anal Sphincter   External Anal Sphincter   Puborectalis   Diastasis Recti   (Blank rows = not tested)        TONE: Pt deferred   PROLAPSE: Pt deferred   TODAY'S TREATMENT:  DATE:   12/03/23 EVAL Examination completed, findings reviewed, pt educated on POC urge drill, feminine moisturizers, HEP, pelvic floor anatomy for females, pelvic floor PT's role in care. Pt motivated to participate in PT and agreeable to attempt recommendations.     PATIENT EDUCATION:  Education details: [id number], urge drill, feminine moisturizers Person educated: Patient Education method: Explanation, Demonstration, Tactile cues, Verbal cues, and Handouts Education comprehension: verbalized  understanding, returned demonstration, verbal cues required, tactile cues required, and needs further education  HOME EXERCISE PROGRAM: [id number]  ASSESSMENT:  CLINICAL IMPRESSION: Patient is a 63 y.o. female  who was seen today for physical therapy evaluation and treatment for urinary incontinence. Pt found to have decreased flexibility in spine and hips, decreased core and hip strength bil, has leakage with stressors and urgency, and has had fecal incontinence with urgency. Pt reports she is postmenopausal and had one c-section. Pt deferred internal pelvic floor assessment today, but time spent on education for pelvic floor awareness, HEP including towel roll for improved awareness of pelvic floor activation and relaxation with pt reporting she does feel this and no outward signs of compensation. Educated on rational for internal pelvic floor assessment and reported understanding but would like to wait at this time. Pt reported dryness felt externally and educated on moisturizers over the counter if interested and may discuss with referring provider if needed as well. Pt would benefit from additional PT to further address deficits.    OBJECTIVE IMPAIRMENTS: decreased activity tolerance, decreased coordination, decreased endurance, decreased mobility, decreased strength, increased fascial restrictions, impaired perceived functional ability, increased muscle spasms, impaired flexibility, improper body mechanics, postural dysfunction, and pain.   ACTIVITY LIMITATIONS: carrying, lifting, squatting, stairs, transfers, continence, and locomotion level  PARTICIPATION LIMITATIONS: community activity and yard work  PERSONAL FACTORS: Time since onset of injury/illness/exacerbation and 1 comorbidity: medical history are also affecting patient's functional outcome.   REHAB POTENTIAL: Good  CLINICAL DECISION MAKING: Stable/uncomplicated  EVALUATION COMPLEXITY: Low   GOALS: Goals reviewed with patient?  Yes  SHORT TERM GOALS: Target date: 12/31/23  Pt to be I with HEP for carry over and continuing recommendations for improved outcomes.   Baseline: Goal status: INITIAL  2.  Pt will be independent with the knack, urge suppression technique, and double voiding in order to improve bladder habits and decrease urinary incontinence.   Baseline:  Goal status: INITIAL  3.  Pt will be independent with use of squatty potty, relaxed toileting mechanics, and improved bowel movement techniques in order to increase ease of bowel movements and complete evacuation.   Baseline:  Goal status: INITIAL   LONG TERM GOALS: Target date: 06/01/24  Pt to be I with advanced HEP for carry over and continuing recommendations for improved outcomes.   Baseline:  Goal status: INITIAL  2.  Pt to demonstrate improved posture midline upright for at least 30 mins without compensation for decreased strain at pelvic floor and improved core strength.  Baseline:  Goal status: INITIAL  3.  Pt to demonstrate improved coordination of pelvic floor and breathing mechanics with 10# squat with appropriate synergistic patterns to decrease pain and leakage at least 75% of the time for improved ability to complete a 30 minute walk without strain at pelvic floor and symptoms.    Baseline:  Goal status: INITIAL  4.  Pt to report ability to complete 1 hour walk or bike ride without urinary incontinence for improved confidence with increasing activity levels and community outings.  Baseline:  Goal status: INITIAL  5.  Pt to report ability to sneeze and cough at least 75% of the time without leakage for improved skin integrity.  Baseline:  Goal status: INITIAL   PLAN:  PT FREQUENCY: 1x/week  PT DURATION: 12 sessions  PLANNED INTERVENTIONS: 97110-Therapeutic exercises, 97530- Therapeutic activity, 97112- Neuromuscular re-education, 97535- Self Care, [id number]- Manual therapy, [phone]- Canalith repositioning, [id number]- Aquatic Therapy,  [phone]- Electrical stimulation (manual), [phone] (1-2 muscles), [phone] (3+ muscles)- Dry Needling, Patient/Family education, Taping, Joint mobilization, Spinal mobilization, Scar mobilization, DME instructions, Cryotherapy, Moist heat, and Biofeedback  PLAN FOR NEXT SESSION: breathing mechanics, voiding mechanics, urge drill, knack, coordination pelvic floor and breathing and activity    Darryle Navy, PT, DPT 09/17/254:15 PM  [facility] [address], [address] [place], [place] [address] Phone # [phone] Fax [phone]

## 2023-12-03 NOTE — Patient Instructions (Addendum)
 Moisturizers They are used in the vagina to hydrate the mucous membrane that make up the vaginal canal. Designed to keep a more normal acid balance (ph) Once placed in the vagina, it will last between two to three days.  Use 2-3 times per week at bedtime  Ingredients to avoid is glycerin and fragrance, can increase chance of infection Should not be used just before sex due to causing irritation Most are gels administered either in a tampon-shaped applicator or as a vaginal suppository. They are non-hormonal.   Types of Moisturizers(internal use)  Vitamin E vaginal suppositories- Whole foods, Amazon Moist Again Coconut oil- can break down condoms, any grocery store (prefer organic) Julva- (Do no use if taking  Tamoxifen) amazon Yes moisturizer- amazon NeuEve Silk , NeuEve Silver for menopausal or over 65 (if have severe vaginal atrophy or cancer treatments use NeuEve Silk for  1 month than move to [employer])- [employer], [place].com Olive and Bee intimate cream- www.oliveandbee.com.au Mae vaginal moisturizer- Amazon Aloe Good Clean Love Hyaluronic acid Hyalofemme Reveree hyaluronic acid inserts   Creams to use externally on the Vulva area Marathon Oil (good for for cancer patients that had radiation to the area)- guam or [employer].https://garcia-valdez.org/ Vulva Balm/ V-magic cream by medicine mama- amazon Julva-amazon Vital V Wild Yam salve ( help moisturize and help with thinning vulvar area, does have Beeswax MoodMaid Botanical Pro-Meno Wild Yam Cream- Amazon Desert Harvest Gele Cleo by Sherrlyn labial moisturizer (Amazon),  Coconut or olive oil aloe Good Clean Love Enchanted Rose by intimate rose  Things to avoid in the vaginal area Do not use things to irritate the vulvar area No lotions just specialized creams for the vulva area- Neogyn, V-magic,  No soaps; can use Aveeno or Calendula cleanser, unscented Dove if needed. Must be gentle No deodorants No douches Good to  sleep without underwear to let the vaginal area to air out No scrubbing: spread the lips to let warm water rinse over labias and pat dry   Urge Incontinence  Ideal urination frequency is every 2-4 wakeful hours, which equates to 5-8 times within a 24-hour period.   Urge incontinence is leakage that occurs when the bladder muscle contracts, creating a sudden need to go before getting to the bathroom.   Going too often when your bladder isn't actually full can disrupt the body's automatic signals to store and hold urine longer, which will increase urgency/frequency.  In this case, the bladder "is running the show" and strategies can be learned to retrain this pattern.   One should be able to control the first urge to urinate, at around .  The bladder can hold up to a "grande latte," or . To help you gain control, practice the Urge Drill below when urgency strikes.  This drill will help retrain your bladder signals and allow you to store and hold urine longer.  The overall goal is to stretch out your time between voids to reach a more manageable voiding schedule.    Practice your quick flicks often throughout the day (each waking hour) even when you don't need feel the urge to go.  This will help strengthen your pelvic floor muscles, making them more effective in controlling leakage.  Urge Drill  When you feel an urge to go, follow these steps to regain control: Stop what you are doing and be still Take one deep breath, directing your air into your abdomen Think an affirming thought, such as "I've got this." Do 5 quick flicks of  your pelvic floor Walk with control to the bathroom to void, or delay voiding   THE KNACK  The Knack is a strategy you may use to help to reduce or prevent leakage or passing of urine, gas or feces during an activity that causes downward force on the pelvic floor muscles.    Activities that can cause downward pressure on the pelvic floor muscles include  coughing, sneezing, laughing, bending, lifting, and transitioning from different body positions such as from laying down to sitting up and sitting to standing.  To perform The Knack, consciously squeeze and lift your pelvic floor muscles to perform a strong, well-timed pelvic muscle contraction BEFORE AND DURING these activities above.  As your contraction gets more coordinated and your muscles get stronger, you will become more effective in controlling your experience of incontinence or gas passing during these activities.

## 2024-01-08 ENCOUNTER — Telehealth: Admitting: Obstetrics and Gynecology

## 2024-01-08 ENCOUNTER — Ambulatory Visit

## 2024-01-20 ENCOUNTER — Encounter: Admitting: Physical Therapy

## 2024-01-27 ENCOUNTER — Encounter: Admitting: Physical Therapy

## 2024-02-02 ENCOUNTER — Telehealth (INDEPENDENT_AMBULATORY_CARE_PROVIDER_SITE_OTHER): Admitting: Obstetrics & Gynecology

## 2024-02-02 DIAGNOSIS — E1169 Type 2 diabetes mellitus with other specified complication: Secondary | ICD-10-CM

## 2024-02-02 DIAGNOSIS — Z780 Asymptomatic menopausal state: Secondary | ICD-10-CM

## 2024-02-02 DIAGNOSIS — I2510 Atherosclerotic heart disease of native coronary artery without angina pectoris: Secondary | ICD-10-CM

## 2024-02-02 DIAGNOSIS — E781 Pure hyperglyceridemia: Secondary | ICD-10-CM

## 2024-02-02 NOTE — Progress Notes (Unsigned)
 GYNECOLOGY VIRTUAL VISIT ENCOUNTER NOTE  Provider location: [facility] for [facility] [place]   Patient location: Home  I connected with Tiffany Riddle on 02/02/24 at  2:30 PM EST by MyChart Video Encounter and verified that I am speaking with the correct person using two identifiers.   I discussed the limitations, risks, security and privacy concerns of performing an evaluation and management service virtually and the availability of in person appointments. I also discussed with the patient that there may be a patient responsible charge related to this service. The patient expressed understanding and agreed to proceed.   History:  Tiffany Riddle is a 63 y.o. G1P1 female being evaluated today for continuing HRT.  SABRAShe was last seen by Dr. Erik on October 08, 2023 for her annual exam.   She has had  a hysterectomy and is currently on o.o75 vivelle  dot twice a week.  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 She underwent CABG x2 with LIMA to the LAD and SVG to the OM1. Patient had small targets and given her NSTEMI she was kept on DAPT and completed one year of therapy. Tiffany Riddle would like to stay on her HRT if this is ok.  Per Dr. Willistine note, she is aware of non hormonal options although not protective against colon cancer.     Past Medical History:  Diagnosis Date   Bell's palsy    Benign tumor of adrenal gland    Colon polyps    Diabetes mellitus without complication (HCC)    Heart attack (HCC)    Hyperlipemia    Mitral valve prolapse    Past Surgical History:  Procedure Laterality Date   ABDOMINAL HYSTERECTOMY     CESAREAN SECTION     heart bypass     TONSILLECTOMY      The following portions of the patient's history were reviewed and updated as appropriate: allergies, current medications, past family history, past medical history, past social history, past surgical history and problem list.   Health Maintenance:  Pap not necessary--s/p hyst .  Normal mammogram on Sept 9, 2025 at [facility]  Review of Systems:  Pertinent items noted in HPI and remainder of comprehensive ROS otherwise negative.  Physical Exam:   General:  Alert, oriented and cooperative. Patient appears to be in no acute distress.  Mental Status: Normal mood and affect. Normal behavior. Normal judgment and thought content.   Respiratory: Normal respiratory effort, no problems with respiration noted  Rest of physical exam deferred due to type of encounter  Labs and Imaging No results found for this or any previous visit (from the past 2 weeks). No results found.     Assessment and Plan:  Tiffany Riddle is a 63 yo female with history of hysterectomy, Type 2 DM, hypercholesterolemia and s/p 2 vessel CABG approx a year ago.    Will consult with our preventive cardiologists about continuing estrogen only HRT.       I discussed the assessment and treatment plan with the patient. The patient was provided an opportunity to ask questions and all were answered. The patient agreed with the plan and demonstrated an understanding of the instructions.  The patient was advised to call back or seek an in-person evaluation/go to the ED if the symptoms worsen or if the condition fails to improve as anticipated.  I provided 10 minutes of face-to-face time during this encounter. I also spent 45 minutes dedicated to the care of this patient including pre-visit review of records, post visit ordering of medications and appropriate tests or procedures, coordinating care and documenting this visit encounter.    Burnard Pate, MD [facility] for [employer], [facility]

## 2024-02-03 ENCOUNTER — Encounter: Admitting: Physical Therapy

## 2024-02-09 ENCOUNTER — Encounter: Payer: Self-pay | Admitting: Obstetrics & Gynecology

## 2024-02-09 DIAGNOSIS — I2510 Atherosclerotic heart disease of native coronary artery without angina pectoris: Secondary | ICD-10-CM | POA: Insufficient documentation

## 2024-02-17 ENCOUNTER — Encounter: Admitting: Physical Therapy

## 2024-02-24 ENCOUNTER — Encounter: Admitting: Physical Therapy

## 2024-03-01 ENCOUNTER — Telehealth: Payer: Self-pay

## 2024-03-01 ENCOUNTER — Other Ambulatory Visit (HOSPITAL_BASED_OUTPATIENT_CLINIC_OR_DEPARTMENT_OTHER): Payer: Self-pay

## 2024-03-01 DIAGNOSIS — Z780 Asymptomatic menopausal state: Secondary | ICD-10-CM

## 2024-03-01 MED ORDER — ESTRADIOL 0.075 MG/24HR TD PTTW
MEDICATED_PATCH | TRANSDERMAL | 3 refills | Status: AC
  Filled 2024-03-01 – 2024-03-24 (×3): qty 8, 28d supply, fill #0
  Filled 2024-04-19: qty 8, 28d supply, fill #1

## 2024-03-01 NOTE — Telephone Encounter (Signed)
 RN received notice from [facility] in need of alternate of Vivelle  Dot as on back order. RN spoke with patient who reported still had a few patches. RN offered to send to [facility] pharmacy as currently in stock. Pt reported unsure if they accept her insurance. RN reported [facility] will file and let her know. RN spoke wit [facility] pharmacy after prescription to follow up on cost for patient. Per [facility] pharmacy, $5 copay, if wants mail order will need to call. RN notified patient and gave pharmacy phone number.  Silvano LELON Piano, RN

## 2024-03-05 ENCOUNTER — Other Ambulatory Visit (HOSPITAL_COMMUNITY): Payer: Self-pay

## 2024-03-05 ENCOUNTER — Other Ambulatory Visit: Payer: Self-pay

## 2024-03-08 ENCOUNTER — Encounter: Payer: Self-pay | Admitting: Pharmacist

## 2024-03-08 ENCOUNTER — Other Ambulatory Visit: Payer: Self-pay

## 2024-03-12 ENCOUNTER — Other Ambulatory Visit: Payer: Self-pay

## 2024-03-24 ENCOUNTER — Other Ambulatory Visit (HOSPITAL_COMMUNITY): Payer: Self-pay

## 2024-03-24 ENCOUNTER — Other Ambulatory Visit: Payer: Self-pay

## 2024-03-25 ENCOUNTER — Other Ambulatory Visit (HOSPITAL_BASED_OUTPATIENT_CLINIC_OR_DEPARTMENT_OTHER): Payer: Self-pay

## 2024-04-20 ENCOUNTER — Other Ambulatory Visit (HOSPITAL_COMMUNITY): Payer: Self-pay

## 2024-04-20 ENCOUNTER — Other Ambulatory Visit: Payer: Self-pay

## 2024-04-21 ENCOUNTER — Other Ambulatory Visit: Payer: Self-pay
